# Patient Record
Sex: Male | Born: 1960 | Race: White | Hispanic: No | Marital: Married | Smoking: Never smoker
Health system: Southern US, Community
[De-identification: ages and names within clinical notes are randomized; demographics above are authoritative.]

## PROBLEM LIST (undated history)

## (undated) DIAGNOSIS — E785 Hyperlipidemia, unspecified: Secondary | ICD-10-CM

## (undated) DIAGNOSIS — I2510 Atherosclerotic heart disease of native coronary artery without angina pectoris: Secondary | ICD-10-CM

## (undated) HISTORY — DX: Hyperlipidemia, unspecified: E78.5

## (undated) HISTORY — DX: Atherosclerotic heart disease of native coronary artery without angina pectoris: I25.10

---

## 2006-06-08 ENCOUNTER — Encounter: Admission: RE | Admit: 2006-06-08 | Discharge: 2006-06-08 | Payer: Self-pay | Admitting: Internal Medicine

## 2006-10-12 ENCOUNTER — Encounter: Admission: RE | Admit: 2006-10-12 | Discharge: 2006-10-12 | Payer: Self-pay | Admitting: Internal Medicine

## 2007-10-20 IMAGING — CR DG CHEST 2V
3 series · 3 of 3 positions shown · non-contrast
Comparison: none

CLINICAL DATA: Cough.  Smoker.
 CHEST ? THREE VIEWS:
 Lungs are hyperaerated but clear of an active process.  Normal cardiomediastinal silhouette size and contours.  Intact bony thorax.

[view not recorded (1 of 3)]
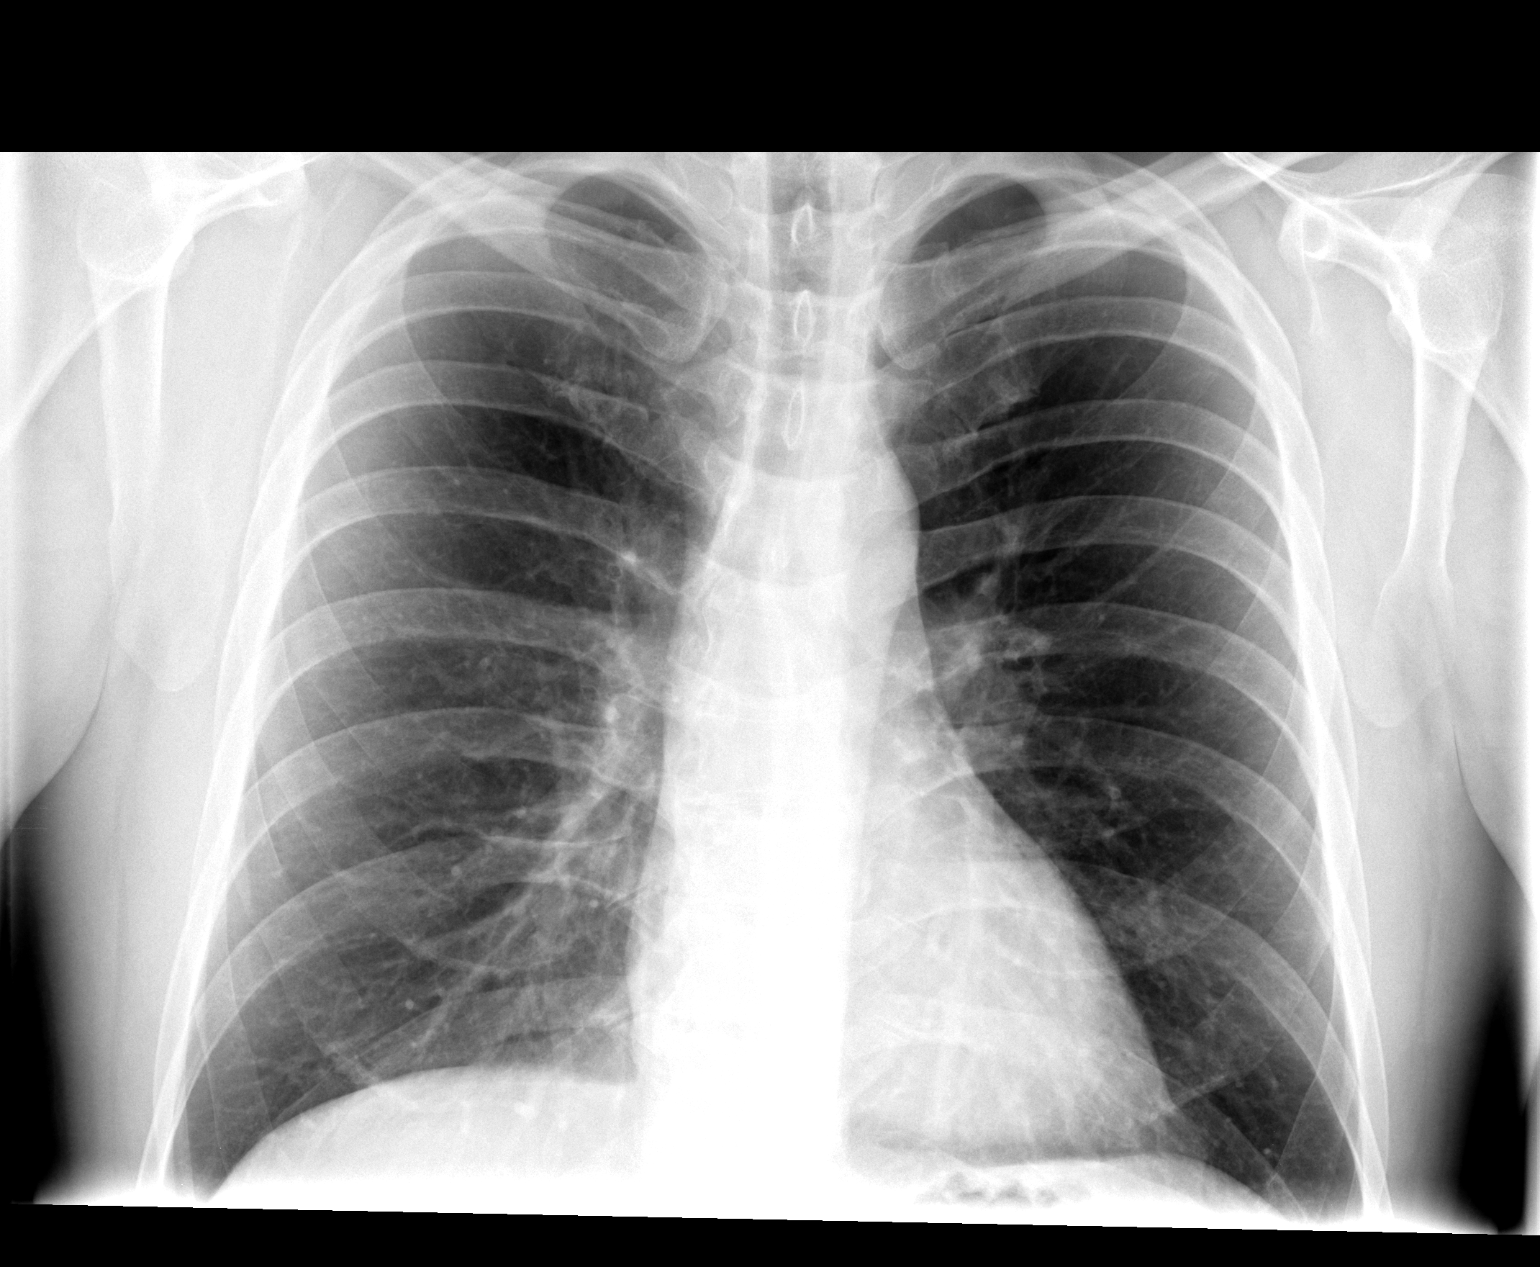

[view not recorded (2 of 3)]
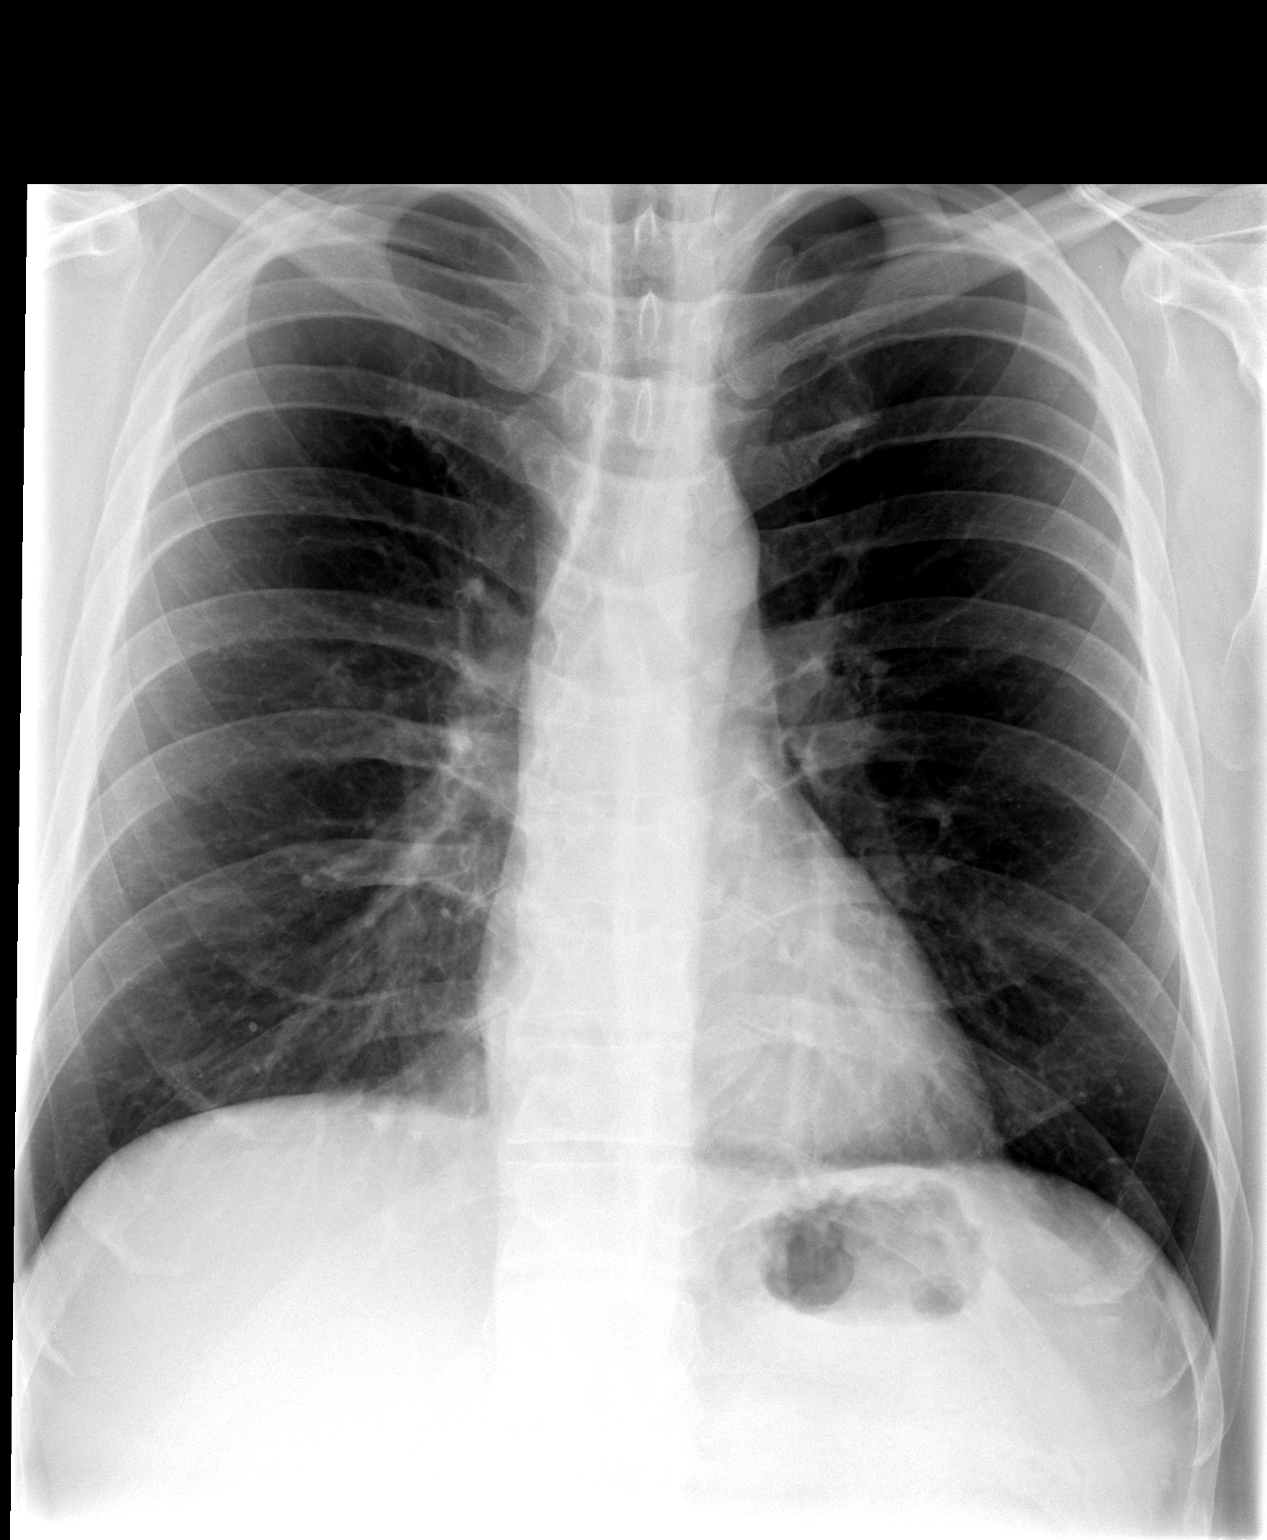

[view not recorded (3 of 3)]
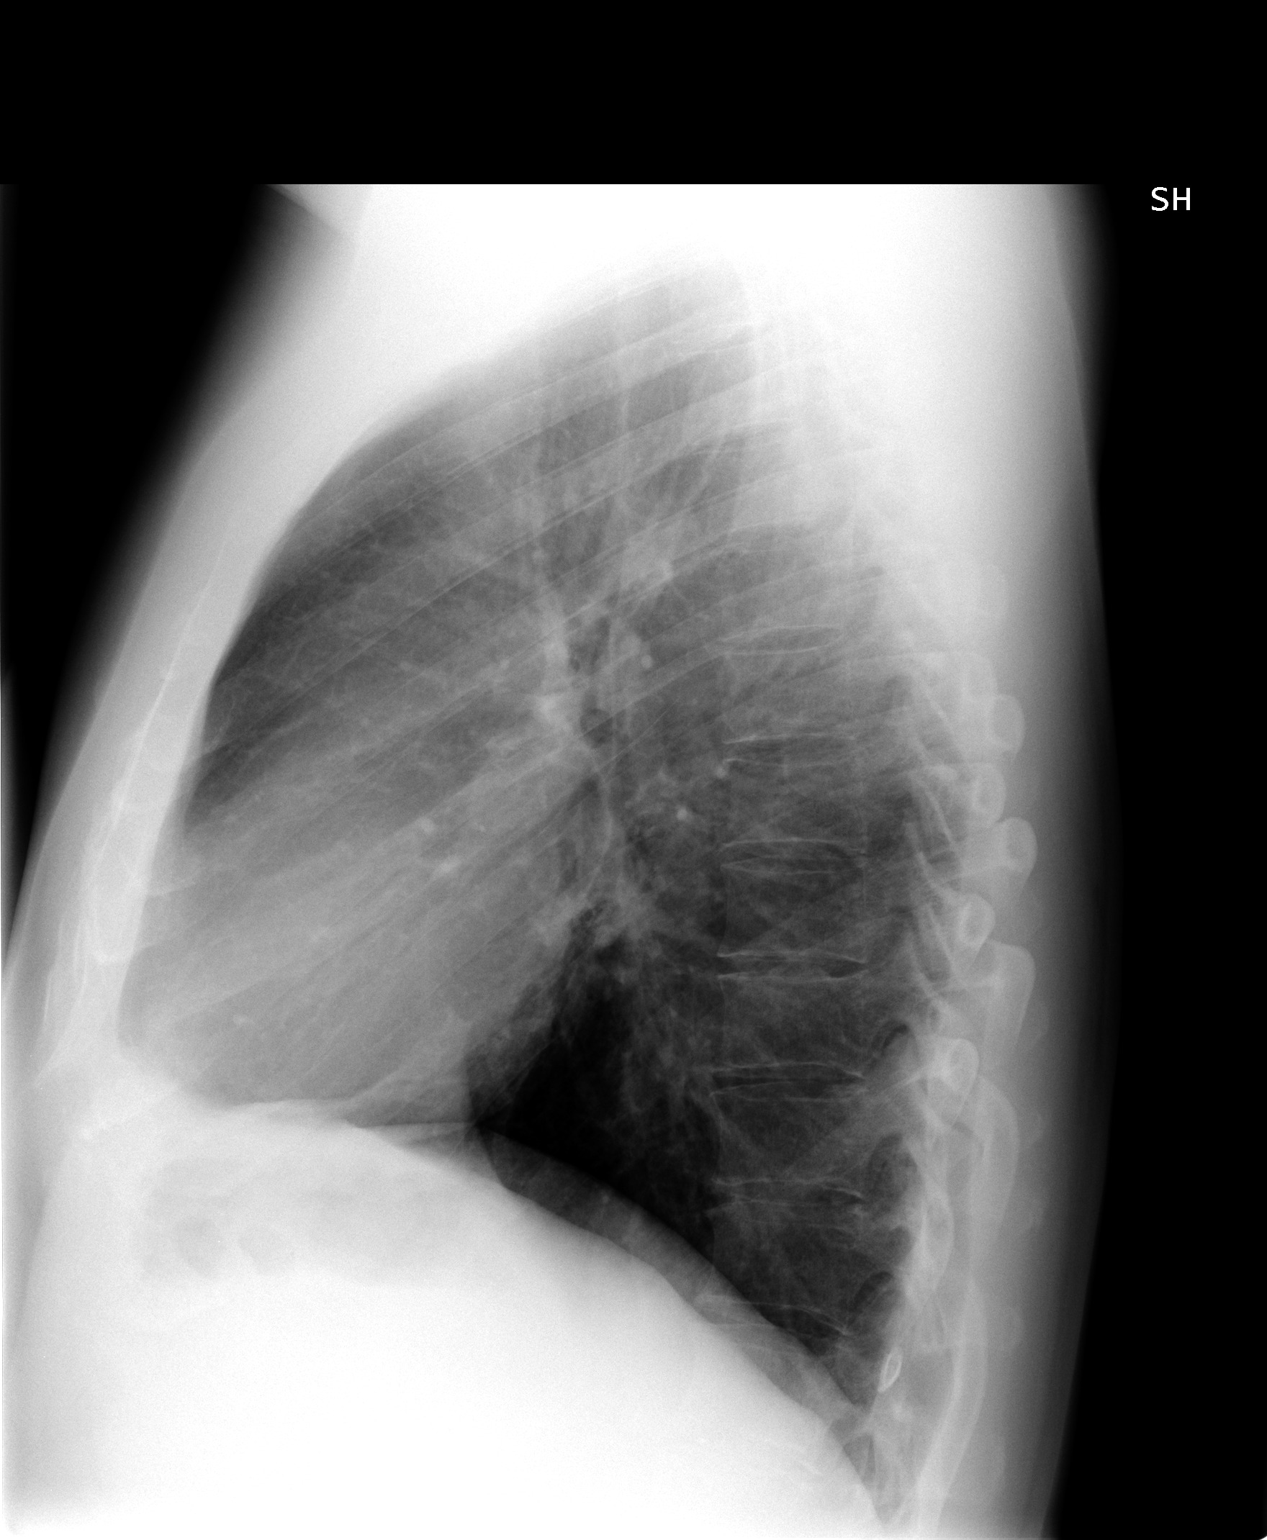

[3 of 3 positions shown; findings below may reference images not displayed]

IMPRESSION: Pulmonary hyperaeration may be due to COPD.  No acute chest findings.

## 2015-11-24 DIAGNOSIS — Z23 Encounter for immunization: Secondary | ICD-10-CM | POA: Diagnosis not present

## 2016-12-01 DIAGNOSIS — Z23 Encounter for immunization: Secondary | ICD-10-CM | POA: Diagnosis not present

## 2016-12-22 DIAGNOSIS — Z136 Encounter for screening for cardiovascular disorders: Secondary | ICD-10-CM | POA: Diagnosis not present

## 2016-12-22 DIAGNOSIS — Z23 Encounter for immunization: Secondary | ICD-10-CM | POA: Diagnosis not present

## 2016-12-22 DIAGNOSIS — Z125 Encounter for screening for malignant neoplasm of prostate: Secondary | ICD-10-CM | POA: Diagnosis not present

## 2016-12-22 DIAGNOSIS — Z0000 Encounter for general adult medical examination without abnormal findings: Secondary | ICD-10-CM | POA: Diagnosis not present

## 2017-11-23 DIAGNOSIS — Z23 Encounter for immunization: Secondary | ICD-10-CM | POA: Diagnosis not present

## 2018-01-23 DIAGNOSIS — Z0000 Encounter for general adult medical examination without abnormal findings: Secondary | ICD-10-CM | POA: Diagnosis not present

## 2018-05-10 DIAGNOSIS — K5730 Diverticulosis of large intestine without perforation or abscess without bleeding: Secondary | ICD-10-CM | POA: Diagnosis not present

## 2018-05-10 DIAGNOSIS — D123 Benign neoplasm of transverse colon: Secondary | ICD-10-CM | POA: Diagnosis not present

## 2018-05-10 DIAGNOSIS — Z86010 Personal history of colonic polyps: Secondary | ICD-10-CM | POA: Diagnosis not present

## 2018-05-10 DIAGNOSIS — K648 Other hemorrhoids: Secondary | ICD-10-CM | POA: Diagnosis not present

## 2018-05-14 DIAGNOSIS — D123 Benign neoplasm of transverse colon: Secondary | ICD-10-CM | POA: Diagnosis not present

## 2018-11-22 DIAGNOSIS — Z23 Encounter for immunization: Secondary | ICD-10-CM | POA: Diagnosis not present

## 2019-02-24 DIAGNOSIS — Z23 Encounter for immunization: Secondary | ICD-10-CM | POA: Diagnosis not present

## 2019-02-24 DIAGNOSIS — Z0000 Encounter for general adult medical examination without abnormal findings: Secondary | ICD-10-CM | POA: Diagnosis not present

## 2019-02-24 DIAGNOSIS — Z125 Encounter for screening for malignant neoplasm of prostate: Secondary | ICD-10-CM | POA: Diagnosis not present

## 2019-02-24 DIAGNOSIS — Z13220 Encounter for screening for lipoid disorders: Secondary | ICD-10-CM | POA: Diagnosis not present

## 2019-07-23 DIAGNOSIS — Z23 Encounter for immunization: Secondary | ICD-10-CM | POA: Diagnosis not present

## 2019-11-28 DIAGNOSIS — Z23 Encounter for immunization: Secondary | ICD-10-CM | POA: Diagnosis not present

## 2020-02-25 DIAGNOSIS — Z0000 Encounter for general adult medical examination without abnormal findings: Secondary | ICD-10-CM | POA: Diagnosis not present

## 2020-02-25 DIAGNOSIS — Z13220 Encounter for screening for lipoid disorders: Secondary | ICD-10-CM | POA: Diagnosis not present

## 2020-02-25 DIAGNOSIS — Z125 Encounter for screening for malignant neoplasm of prostate: Secondary | ICD-10-CM | POA: Diagnosis not present

## 2020-03-10 DIAGNOSIS — E538 Deficiency of other specified B group vitamins: Secondary | ICD-10-CM | POA: Diagnosis not present

## 2020-04-12 DIAGNOSIS — E538 Deficiency of other specified B group vitamins: Secondary | ICD-10-CM | POA: Diagnosis not present

## 2020-06-09 DIAGNOSIS — E538 Deficiency of other specified B group vitamins: Secondary | ICD-10-CM | POA: Diagnosis not present

## 2020-07-09 DIAGNOSIS — E538 Deficiency of other specified B group vitamins: Secondary | ICD-10-CM | POA: Diagnosis not present

## 2020-07-23 DIAGNOSIS — L720 Epidermal cyst: Secondary | ICD-10-CM | POA: Diagnosis not present

## 2020-08-26 DIAGNOSIS — E538 Deficiency of other specified B group vitamins: Secondary | ICD-10-CM | POA: Diagnosis not present

## 2020-09-20 DIAGNOSIS — E559 Vitamin D deficiency, unspecified: Secondary | ICD-10-CM | POA: Diagnosis not present

## 2020-09-20 DIAGNOSIS — R030 Elevated blood-pressure reading, without diagnosis of hypertension: Secondary | ICD-10-CM | POA: Diagnosis not present

## 2020-09-20 DIAGNOSIS — E538 Deficiency of other specified B group vitamins: Secondary | ICD-10-CM | POA: Diagnosis not present

## 2020-10-20 DIAGNOSIS — E538 Deficiency of other specified B group vitamins: Secondary | ICD-10-CM | POA: Diagnosis not present

## 2020-12-02 DIAGNOSIS — Z23 Encounter for immunization: Secondary | ICD-10-CM | POA: Diagnosis not present

## 2021-03-10 DIAGNOSIS — E538 Deficiency of other specified B group vitamins: Secondary | ICD-10-CM | POA: Diagnosis not present

## 2021-03-10 DIAGNOSIS — Z13220 Encounter for screening for lipoid disorders: Secondary | ICD-10-CM | POA: Diagnosis not present

## 2021-03-10 DIAGNOSIS — Z125 Encounter for screening for malignant neoplasm of prostate: Secondary | ICD-10-CM | POA: Diagnosis not present

## 2021-03-10 DIAGNOSIS — E559 Vitamin D deficiency, unspecified: Secondary | ICD-10-CM | POA: Diagnosis not present

## 2021-03-10 DIAGNOSIS — Z0000 Encounter for general adult medical examination without abnormal findings: Secondary | ICD-10-CM | POA: Diagnosis not present

## 2021-03-16 DIAGNOSIS — Z0000 Encounter for general adult medical examination without abnormal findings: Secondary | ICD-10-CM | POA: Diagnosis not present

## 2022-03-10 DIAGNOSIS — E538 Deficiency of other specified B group vitamins: Secondary | ICD-10-CM | POA: Diagnosis not present

## 2022-03-10 DIAGNOSIS — E7800 Pure hypercholesterolemia, unspecified: Secondary | ICD-10-CM | POA: Diagnosis not present

## 2022-03-10 DIAGNOSIS — Z0000 Encounter for general adult medical examination without abnormal findings: Secondary | ICD-10-CM | POA: Diagnosis not present

## 2022-03-10 DIAGNOSIS — E559 Vitamin D deficiency, unspecified: Secondary | ICD-10-CM | POA: Diagnosis not present

## 2022-03-10 DIAGNOSIS — Z125 Encounter for screening for malignant neoplasm of prostate: Secondary | ICD-10-CM | POA: Diagnosis not present

## 2022-03-10 DIAGNOSIS — R7309 Other abnormal glucose: Secondary | ICD-10-CM | POA: Diagnosis not present

## 2022-03-10 DIAGNOSIS — E669 Obesity, unspecified: Secondary | ICD-10-CM | POA: Diagnosis not present

## 2022-03-17 ENCOUNTER — Other Ambulatory Visit (HOSPITAL_COMMUNITY): Payer: Self-pay | Admitting: Family Medicine

## 2022-03-17 DIAGNOSIS — Z0000 Encounter for general adult medical examination without abnormal findings: Secondary | ICD-10-CM

## 2022-04-13 DIAGNOSIS — R109 Unspecified abdominal pain: Secondary | ICD-10-CM | POA: Diagnosis not present

## 2022-04-19 ENCOUNTER — Ambulatory Visit (HOSPITAL_BASED_OUTPATIENT_CLINIC_OR_DEPARTMENT_OTHER)
Admission: RE | Admit: 2022-04-19 | Discharge: 2022-04-19 | Disposition: A | Discharge status: Home / Self Care | Payer: Self-pay | Source: Ambulatory Visit | Attending: Family Medicine | Admitting: Family Medicine

## 2022-04-19 DIAGNOSIS — Z0000 Encounter for general adult medical examination without abnormal findings: Secondary | ICD-10-CM | POA: Insufficient documentation

## 2022-04-24 ENCOUNTER — Encounter: Payer: Self-pay | Admitting: Cardiovascular Disease

## 2022-04-24 NOTE — Progress Notes (Unsigned)
Cardiology Office Note:    Date:  04/26/2022   ID:  Mitchell Fox, DOB [birth year], MRN [id number]  PCP:  Mitchell Cruel, MD   Mitchell Fox Providers Cardiologist:  Mitchell Fox Click to update primary MD,subspecialty MD or APP then REFRESH:1}    Referring MD: No ref. provider found   Chief Complaint  Patient presents with   coronary calcification    History of Present Illness:    Mitchell Fox is a 62 y.o. male with a hx of coronary artery calcifications.  Seen with wife , Mitchell Fox    CAC score is 739 Left anterior descending artery: 422   Left circumflex artery: 75.3   Right coronary artery: 242   Total: 739   Percentile: 92  Walks frequently  Has not been walking for the past 3 months . Walked this weekend, no CP or dyspnea   + fam hx . Uncle died of MI in his 44s Grandfather had CAD   Here for good recent labs from his primary medical doctor from March 10, 2022 Total cholesterol is 189 HDL is 62 LDL is 110 Triglyceride level is 92 Hemoglobin A1c is 5.9 Creatinine is 0.83 Potassium is 4.6    History reviewed. No pertinent past medical history.  History reviewed. No pertinent surgical history.  Current Medications: Current Meds  Medication Sig   ezetimibe (ZETIA) 10 MG tablet Take 1 tablet (10 mg total) by mouth daily.   metoprolol tartrate (LOPRESSOR) 100 MG tablet Take 1 tablet by mouth 2 hours prior to scan   rosuvastatin (CRESTOR) 20 MG tablet Take 20 mg by mouth at bedtime.     Allergies:   Patient has no allergy information on record.   Social History   Socioeconomic History   Marital status: Married    Spouse name: Not on file   Number of children: Not on file   Years of education: Not on file   Highest education level: Not on file  Occupational History   Not on file  Tobacco Use   Smoking status: Never   Smokeless tobacco: Never  Substance and Sexual Activity   Alcohol use: Not on file   Drug use: Not on file    Sexual activity: Not on file  Other Topics Concern   Not on file  Social History Narrative   Not on file   Social Determinants of Health   Financial Resource Strain: Not on file  Food Insecurity: Not on file  Transportation Needs: Not on file  Physical Activity: Not on file  Stress: Not on file  Social Connections: Not on file     Family History: The patient's family history is not on file.  ROS:   Please see the history of present illness.     All other systems reviewed and are negative.  EKGs/Labs/Other Studies Reviewed:    The following studies were reviewed today:   EKG: April 25, 2022: Normal sinus rhythm at 75.  Incomplete right bundle branch block.  Inferior wall myocardial infarction, age undetermined Recent Labs: 04/25/2022: BUN 13; Creatinine, Ser 0.82; Potassium 4.0; Sodium 143  Recent Lipid Panel No results found for: "CHOL", "TRIG", "HDL", "CHOLHDL", "VLDL", "LDLCALC", "LDLDIRECT"   Risk Assessment/Calculations:                Physical Exam:    VS:  BP 130/76   Pulse 75   Ht 6' (1.829 m)   Wt 230 lb 3.2 oz (104.4 kg)   SpO2 98%   BMI  31.22 kg/m     Wt Readings from Last 3 Encounters:  04/25/22 230 lb 3.2 oz (104.4 kg)     GEN:  Well nourished, well developed in no acute distress HEENT: Normal NECK: No JVD; No carotid bruits LYMPHATICS: No lymphadenopathy CARDIAC: RRR, no murmurs, rubs, gallops RESPIRATORY:  Clear to auscultation without rales, wheezing or rhonchi  ABDOMEN: Soft, non-tender, non-distended MUSCULOSKELETAL:  No edema; No deformity  SKIN: Warm and dry NEUROLOGIC:  Alert and oriented x 3 PSYCHIATRIC:  Normal affect   ASSESSMENT:    1. Abnormal electrocardiogram   2. Mixed hyperlipidemia   3. Preprocedural cardiovascular examination    PLAN:      Coronary artery disease: Care presents with coronary calcium score 472.  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  I would like to do a  coronary CT angiogram for further evaluation of this.  2.  Hyperlipidemia: His last LDL was 110.  He is on rosuvastatin 20 mg a day.  Will add Zetia 10 mg a day. Will recheck lipids when I see him again in 4 to 6 weeks.  Will draw a lipoprotein a today.           Medication Adjustments/Labs and Tests Ordered: Current medicines are reviewed at length with the patient today.  Concerns regarding medicines are outlined above.  Orders Placed This Encounter  Procedures   CT CORONARY MORPH W/CTA COR W/SCORE W/CA W/CM &/OR WO/CM   Lipoprotein A (LPA)   Basic metabolic panel   Lipid panel   EKG 12-Lead   Meds ordered this encounter  Medications   metoprolol tartrate (LOPRESSOR) 100 MG tablet    Sig: Take 1 tablet by mouth 2 hours prior to scan    Dispense:  1 tablet    Refill:  0   ezetimibe (ZETIA) 10 MG tablet    Sig: Take 1 tablet (10 mg total) by mouth daily.    Dispense:  90 tablet    Refill:  3    Patient Instructions  Medication Instructions:  Start Ezetimibe (Zetia) '10mg'$  once daily  *If you need a refill on your cardiac medications before your next appointment, please call your pharmacy*   Lab Work: BMET and Lp(a) today  If you have labs (blood work) drawn today and your tests are completely normal, you will receive your results only by: [place] (if you have MyChart) OR A paper copy in the mail If you have any lab test that is abnormal or we need to change your treatment, we will call you to review the results.   Testing/Procedures: Coronary CTA Your physician has requested that you have cardiac CT. Cardiac computed tomography (CT) is a painless test that uses an x-ray machine to take clear, detailed pictures of your heart. For further information please visit HugeFiesta.[email or web address]. Please follow instruction sheet as given.     Follow-Up: At [facility], you and your health needs are our priority.  As part of our continuing mission to provide you  with exceptional heart care, we have created designated Provider Care Teams.  These Care Teams include your primary Cardiologist (physician) and Advanced Practice Providers (APPs -  Physician Assistants and Nurse Practitioners) who all work together to provide you with the care you need, when you need it.  We recommend signing up for the patient portal called "MyChart".  Sign up information is provided on this After Visit Summary.  MyChart is used to connect with  patients for Virtual Visits (Telemedicine).  Patients are able to view lab/test results, encounter notes, upcoming appointments, etc.  Non-urgent messages can be sent to your provider as well.   To learn more about what you can do with MyChart, go to [email or web address].    Your next appointment:   4 week(s)  Provider:   Mertie Moores, MD     Your cardiac CT will be scheduled at:   [facility] [address] [place], [place] [address] [phone]   Please arrive at the [facility] and Children's Entrance (Entrance C2) of [facility] 30 minutes prior to test start time. You can use the FREE valet parking offered at entrance C (encouraged to control the heart rate for the test)  Proceed to the [facility] Radiology Department (first floor) to check-in and test prep.  All radiology patients and guests should use entrance C2 at [facility], accessed from [facility], even though the hospital's physical address listed is [address]      Please follow these instructions carefully (unless otherwise directed):  Hold all erectile dysfunction medications at least 3 days (72 hrs) prior to test. (Ie viagra, cialis, sildenafil, tadalafil, etc) We will administer nitroglycerin during this exam.   On the Night Before the Test: Be sure to Drink plenty of water. Do not consume any caffeinated/decaffeinated beverages or chocolate 12 hours prior to your test. Do not take any  antihistamines 12 hours prior to your test.   On the Day of the Test: Drink plenty of water until 1 hour prior to the test. Do not eat any food 1 hour prior to test. You may take your regular medications prior to the test.  Take metoprolol (Lopressor) two hours prior to test.       After the Test: Drink plenty of water. After receiving IV contrast, you may experience a mild flushed feeling. This is normal. On occasion, you may experience a mild rash up to 24 hours after the test. This is not dangerous. If this occurs, you can take Benadryl 25 mg and increase your fluid intake. If you experience trouble breathing, this can be serious. If it is severe call 911 IMMEDIATELY. If it is mild, please call our office. If you take any of these medications: Glipizide/Metformin, Avandament, Glucavance, please do not take 48 hours after completing test unless otherwise instructed.  We will call to schedule your test 2-4 weeks out understanding that some insurance companies will need an authorization prior to the service being performed.   For non-scheduling related questions, please contact the cardiac imaging nurse navigator should you have any questions/concerns: Marchia Bond, Cardiac Imaging Nurse Navigator Gordy Clement, Cardiac Imaging Nurse Navigator [facility] Direct Office Dial: [phone]   For scheduling needs, including cancellations and rescheduling, please call [place], [phone].    Signed, Mertie Moores, MD  04/26/2022 8:53 AM    [place]

## 2022-04-24 NOTE — H&P (View-Only) (Signed)
Cardiology Office Note:    Date:  04/26/2022   ID:  Mitchell Fox, DOB [birth year], MRN [id number]  PCP:  Lawerance Cruel, MD   [place] Providers Cardiologist:  Sorina Derrig Click to update primary MD,subspecialty MD or APP then REFRESH:1}    Referring MD: No ref. provider found   Chief Complaint  Patient presents with   coronary calcification    History of Present Illness:    Mitchell Fox is a 62 y.o. male with a hx of coronary artery calcifications.  Seen with wife , [name]    CAC score is 739 Left anterior descending artery: 422   Left circumflex artery: 75.3   Right coronary artery: 242   Total: 739   Percentile: 92  Walks frequently  Has not been walking for the past 3 months . Walked this weekend, no CP or dyspnea   + fam hx . Uncle died of MI in his 44s Grandfather had CAD   Here for good recent labs from his primary medical doctor from March 10, 2022 Total cholesterol is 189 HDL is 62 LDL is 110 Triglyceride level is 92 Hemoglobin A1c is 5.9 Creatinine is 0.83 Potassium is 4.6    History reviewed. No pertinent past medical history.  History reviewed. No pertinent surgical history.  Current Medications: Current Meds  Medication Sig   ezetimibe (ZETIA) 10 MG tablet Take 1 tablet (10 mg total) by mouth daily.   metoprolol tartrate (LOPRESSOR) 100 MG tablet Take 1 tablet by mouth 2 hours prior to scan   rosuvastatin (CRESTOR) 20 MG tablet Take 20 mg by mouth at bedtime.     Allergies:   Patient has no allergy information on record.   Social History   Socioeconomic History   Marital status: Married    Spouse name: Not on file   Number of children: Not on file   Years of education: Not on file   Highest education level: Not on file  Occupational History   Not on file  Tobacco Use   Smoking status: Never   Smokeless tobacco: Never  Substance and Sexual Activity   Alcohol use: Not on file   Drug use: Not on file    Sexual activity: Not on file  Other Topics Concern   Not on file  Social History Narrative   Not on file   Social Determinants of Health   Financial Resource Strain: Not on file  Food Insecurity: Not on file  Transportation Needs: Not on file  Physical Activity: Not on file  Stress: Not on file  Social Connections: Not on file     Family History: The patient's family history is not on file.  ROS:   Please see the history of present illness.     All other systems reviewed and are negative.  EKGs/Labs/Other Studies Reviewed:    The following studies were reviewed today:   EKG: April 25, 2022: Normal sinus rhythm at 75.  Incomplete right bundle branch block.  Inferior wall myocardial infarction, age undetermined Recent Labs: 04/25/2022: BUN 13; Creatinine, Ser 0.82; Potassium 4.0; Sodium 143  Recent Lipid Panel No results found for: "CHOL", "TRIG", "HDL", "CHOLHDL", "VLDL", "LDLCALC", "LDLDIRECT"   Risk Assessment/Calculations:                Physical Exam:    VS:  BP 130/76   Pulse 75   Ht 6' (1.829 m)   Wt 230 lb 3.2 oz (104.4 kg)   SpO2 98%   BMI  31.22 kg/m     Wt Readings from Last 3 Encounters:  04/25/22 230 lb 3.2 oz (104.4 kg)     GEN:  Well nourished, well developed in no acute distress HEENT: Normal NECK: No JVD; No carotid bruits LYMPHATICS: No lymphadenopathy CARDIAC: RRR, no murmurs, rubs, gallops RESPIRATORY:  Clear to auscultation without rales, wheezing or rhonchi  ABDOMEN: Soft, non-tender, non-distended MUSCULOSKELETAL:  No edema; No deformity  SKIN: Warm and dry NEUROLOGIC:  Alert and oriented x 3 PSYCHIATRIC:  Normal affect   ASSESSMENT:    1. Abnormal electrocardiogram   2. Mixed hyperlipidemia   3. Preprocedural cardiovascular examination    PLAN:      Coronary artery disease: Care presents with coronary calcium score 472.  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  I would like to do a  coronary CT angiogram for further evaluation of this.  2.  Hyperlipidemia: His last LDL was 110.  He is on rosuvastatin 20 mg a day.  Will add Zetia 10 mg a day. Will recheck lipids when I see him again in 4 to 6 weeks.  Will draw a lipoprotein a today.           Medication Adjustments/Labs and Tests Ordered: Current medicines are reviewed at length with the patient today.  Concerns regarding medicines are outlined above.  Orders Placed This Encounter  Procedures   CT CORONARY MORPH W/CTA COR W/SCORE W/CA W/CM &/OR WO/CM   Lipoprotein A (LPA)   Basic metabolic panel   Lipid panel   EKG 12-Lead   Meds ordered this encounter  Medications   metoprolol tartrate (LOPRESSOR) 100 MG tablet    Sig: Take 1 tablet by mouth 2 hours prior to scan    Dispense:  1 tablet    Refill:  0   ezetimibe (ZETIA) 10 MG tablet    Sig: Take 1 tablet (10 mg total) by mouth daily.    Dispense:  90 tablet    Refill:  3    Patient Instructions  Medication Instructions:  Start Ezetimibe (Zetia) '10mg'$  once daily  *If you need a refill on your cardiac medications before your next appointment, please call your pharmacy*   Lab Work: BMET and Lp(a) today  If you have labs (blood work) drawn today and your tests are completely normal, you will receive your results only by: [place] (if you have MyChart) OR A paper copy in the mail If you have any lab test that is abnormal or we need to change your treatment, we will call you to review the results.   Testing/Procedures: Coronary CTA Your physician has requested that you have cardiac CT. Cardiac computed tomography (CT) is a painless test that uses an x-ray machine to take clear, detailed pictures of your heart. For further information please visit HugeFiesta.[email or web address]. Please follow instruction sheet as given.     Follow-Up: At [facility], you and your health needs are our priority.  As part of our continuing mission to provide you  with exceptional heart care, we have created designated Provider Care Teams.  These Care Teams include your primary Cardiologist (physician) and Advanced Practice Providers (APPs -  Physician Assistants and Nurse Practitioners) who all work together to provide you with the care you need, when you need it.  We recommend signing up for the patient portal called "MyChart".  Sign up information is provided on this After Visit Summary.  MyChart is used to connect with  patients for Virtual Visits (Telemedicine).  Patients are able to view lab/test results, encounter notes, upcoming appointments, etc.  Non-urgent messages can be sent to your provider as well.   To learn more about what you can do with MyChart, go to [email or web address].    Your next appointment:   4 week(s)  Provider:   Mertie Moores, MD     Your cardiac CT will be scheduled at:   [facility] [address] [place], [place] [address] [phone]   Please arrive at the [facility] and Children's Entrance (Entrance C2) of [facility] 30 minutes prior to test start time. You can use the FREE valet parking offered at entrance C (encouraged to control the heart rate for the test)  Proceed to the [facility] Radiology Department (first floor) to check-in and test prep.  All radiology patients and guests should use entrance C2 at [facility], accessed from [facility], even though the hospital's physical address listed is [address]      Please follow these instructions carefully (unless otherwise directed):  Hold all erectile dysfunction medications at least 3 days (72 hrs) prior to test. (Ie viagra, cialis, sildenafil, tadalafil, etc) We will administer nitroglycerin during this exam.   On the Night Before the Test: Be sure to Drink plenty of water. Do not consume any caffeinated/decaffeinated beverages or chocolate 12 hours prior to your test. Do not take any  antihistamines 12 hours prior to your test.   On the Day of the Test: Drink plenty of water until 1 hour prior to the test. Do not eat any food 1 hour prior to test. You may take your regular medications prior to the test.  Take metoprolol (Lopressor) two hours prior to test.       After the Test: Drink plenty of water. After receiving IV contrast, you may experience a mild flushed feeling. This is normal. On occasion, you may experience a mild rash up to 24 hours after the test. This is not dangerous. If this occurs, you can take Benadryl 25 mg and increase your fluid intake. If you experience trouble breathing, this can be serious. If it is severe call 911 IMMEDIATELY. If it is mild, please call our office. If you take any of these medications: Glipizide/Metformin, Avandament, Glucavance, please do not take 48 hours after completing test unless otherwise instructed.  We will call to schedule your test 2-4 weeks out understanding that some insurance companies will need an authorization prior to the service being performed.   For non-scheduling related questions, please contact the cardiac imaging nurse navigator should you have any questions/concerns: Marchia Bond, Cardiac Imaging Nurse Navigator Gordy Clement, Cardiac Imaging Nurse Navigator [facility] Direct Office Dial: [phone]   For scheduling needs, including cancellations and rescheduling, please call [place], [phone].    Signed, Mertie Moores, MD  04/26/2022 8:53 AM    [place]

## 2022-04-25 ENCOUNTER — Encounter: Payer: Self-pay | Admitting: Cardiovascular Disease

## 2022-04-25 ENCOUNTER — Ambulatory Visit: Payer: BC Managed Care – PPO | Attending: Cardiovascular Disease | Admitting: Cardiovascular Disease

## 2022-04-25 VITALS — BP 130/76 | HR 75 | Ht 72.0 in | Wt 230.2 lb

## 2022-04-25 DIAGNOSIS — E782 Mixed hyperlipidemia: Secondary | ICD-10-CM

## 2022-04-25 DIAGNOSIS — Z01810 Encounter for preprocedural cardiovascular examination: Secondary | ICD-10-CM | POA: Diagnosis not present

## 2022-04-25 DIAGNOSIS — R9431 Abnormal electrocardiogram [ECG] [EKG]: Secondary | ICD-10-CM | POA: Diagnosis not present

## 2022-04-25 MED ORDER — EZETIMIBE 10 MG PO TABS: 10.0000 mg | ORAL_TABLET | Freq: Every day | ORAL | 3 refills | Status: DC

## 2022-04-25 MED ORDER — METOPROLOL TARTRATE 100 MG PO TABS: ORAL_TABLET | ORAL | 0 refills | Status: DC

## 2022-04-25 NOTE — Patient Instructions (Signed)
Medication Instructions:  Start Ezetimibe (Zetia) '10mg'$  once daily  *If you need a refill on your cardiac medications before your next appointment, please call your pharmacy*   Lab Work: BMET and Lp(a) today  If you have labs (blood work) drawn today and your tests are completely normal, you will receive your results only by: [place] (if you have MyChart) OR A paper copy in the mail If you have any lab test that is abnormal or we need to change your treatment, we will call you to review the results.   Testing/Procedures: Coronary CTA Your physician has requested that you have cardiac CT. Cardiac computed tomography (CT) is a painless test that uses an x-ray machine to take clear, detailed pictures of your heart. For further information please visit HugeFiesta.[email or web address]. Please follow instruction sheet as given.     Follow-Up: At [facility], you and your health needs are our priority.  As part of our continuing mission to provide you with exceptional heart care, we have created designated Provider Care Teams.  These Care Teams include your primary Cardiologist (physician) and Advanced Practice Providers (APPs -  Physician Assistants and Nurse Practitioners) who all work together to provide you with the care you need, when you need it.  We recommend signing up for the patient portal called "MyChart".  Sign up information is provided on this After Visit Summary.  MyChart is used to connect with patients for Virtual Visits (Telemedicine).  Patients are able to view lab/test results, encounter notes, upcoming appointments, etc.  Non-urgent messages can be sent to your provider as well.   To learn more about what you can do with MyChart, go to [email or web address].    Your next appointment:   4 week(s)  Provider:   Mertie Moores, MD     Your cardiac CT will be scheduled at:   [facility] [address] [place], [place] [address] [phone]   Please arrive at the [facility] and Children's Entrance (Entrance C2) of [facility] 30 minutes prior to test start time. You can use the FREE valet parking offered at entrance C (encouraged to control the heart rate for the test)  Proceed to the [facility] Radiology Department (first floor) to check-in and test prep.  All radiology patients and guests should use entrance C2 at [facility], accessed from [facility], even though the hospital's physical address listed is [address]      Please follow these instructions carefully (unless otherwise directed):  Hold all erectile dysfunction medications at least 3 days (72 hrs) prior to test. (Ie viagra, cialis, sildenafil, tadalafil, etc) We will administer nitroglycerin during this exam.   On the Night Before the Test: Be sure to Drink plenty of water. Do not consume any caffeinated/decaffeinated beverages or chocolate 12 hours prior to your test. Do not take any antihistamines 12 hours prior to your test.   On the Day of the Test: Drink plenty of water until 1 hour prior to the test. Do not eat any food 1 hour prior to test. You may take your regular medications prior to the test.  Take metoprolol (Lopressor) two hours prior to test.       After the Test: Drink plenty of water. After receiving IV contrast, you may experience a mild flushed feeling. This is normal. On occasion, you may experience a mild rash up to 24 hours after the test. This is not dangerous. If this occurs, you  can take Benadryl 25 mg and increase your fluid intake. If you experience trouble breathing, this can be serious. If it is severe call 911 IMMEDIATELY. If it is mild, please call our office. If you take any of these medications: Glipizide/Metformin, Avandament, Glucavance, please do not take 48 hours after completing test unless otherwise instructed.  We will call to schedule your test 2-4 weeks out  understanding that some insurance companies will need an authorization prior to the service being performed.   For non-scheduling related questions, please contact the cardiac imaging nurse navigator should you have any questions/concerns: Marchia Bond, Cardiac Imaging Nurse Navigator Gordy Clement, Cardiac Imaging Nurse Navigator [facility] Direct Office Dial: [phone]   For scheduling needs, including cancellations and rescheduling, please call [place], [phone].

## 2022-04-26 LAB — BASIC METABOLIC PANEL
BUN: 13 mg/dL (ref 8–27)
Chloride: 104 mmol/L (ref 96–106)
Potassium: 4 mmol/L (ref 3.5–5.2)
Sodium: 143 mmol/L (ref 134–144)
eGFR: 99 mL/min/{1.73_m2} (ref >59)

## 2022-04-26 LAB — LIPOPROTEIN A (LPA)

## 2022-04-27 LAB — BASIC METABOLIC PANEL
BUN/Creatinine Ratio: 16 (ref 10–24)
CO2: 23 mmol/L (ref 20–29)
Calcium: 9.4 mg/dL (ref 8.6–10.2)
Creatinine, Ser: 0.82 mg/dL (ref 0.76–1.27)
Glucose: 82 mg/dL (ref 70–99)

## 2022-05-02 ENCOUNTER — Telehealth (HOSPITAL_COMMUNITY): Payer: Self-pay | Admitting: Emergency Medicine

## 2022-05-02 NOTE — Telephone Encounter (Signed)
Reaching out to patient to offer assistance regarding upcoming cardiac imaging study; pt verbalizes understanding of appt date/time, parking situation and where to check in, pre-test NPO status and medications ordered, and verified current allergies; name and call back number provided for further questions should they arise Graviel Payeur RN Navigator Cardiac Imaging [facility] [phone] office [phone] cell  Arrival [address] entrance Denies iv issues 100mg metoprolol tartrate  Aware contrast/nitro   

## 2022-05-03 ENCOUNTER — Ambulatory Visit (HOSPITAL_COMMUNITY)
Admission: RE | Admit: 2022-05-03 | Discharge: 2022-05-03 | Disposition: A | Discharge status: Home / Self Care | Payer: BC Managed Care – PPO | Source: Ambulatory Visit | Attending: Cardiovascular Disease | Admitting: Cardiovascular Disease

## 2022-05-03 DIAGNOSIS — I2510 Atherosclerotic heart disease of native coronary artery without angina pectoris: Secondary | ICD-10-CM | POA: Diagnosis not present

## 2022-05-03 DIAGNOSIS — Z01810 Encounter for preprocedural cardiovascular examination: Secondary | ICD-10-CM | POA: Diagnosis not present

## 2022-05-03 DIAGNOSIS — R9431 Abnormal electrocardiogram [ECG] [EKG]: Secondary | ICD-10-CM

## 2022-05-03 DIAGNOSIS — E782 Mixed hyperlipidemia: Secondary | ICD-10-CM | POA: Insufficient documentation

## 2022-05-03 MED ORDER — IOHEXOL 350 MG/ML SOLN
200.0000 mL | Freq: Once | INTRAVENOUS | Status: AC | PRN
  Administered 2022-05-03: 200 mL via INTRAVENOUS

## 2022-05-03 MED ORDER — NITROGLYCERIN 0.4 MG SL SUBL
SUBLINGUAL_TABLET | SUBLINGUAL | Status: AC
  Filled 2022-05-03: qty 2

## 2022-05-03 MED ORDER — NITROGLYCERIN 0.4 MG SL SUBL
0.8000 mg | SUBLINGUAL_TABLET | Freq: Once | SUBLINGUAL | Status: AC
  Administered 2022-05-03: 0.8 mg via SUBLINGUAL

## 2022-05-04 ENCOUNTER — Ambulatory Visit (INDEPENDENT_AMBULATORY_CARE_PROVIDER_SITE_OTHER): Payer: BC Managed Care – PPO

## 2022-05-04 ENCOUNTER — Encounter: Payer: Self-pay | Admitting: Cardiovascular Disease

## 2022-05-04 ENCOUNTER — Other Ambulatory Visit: Payer: Self-pay | Admitting: Cardiovascular Disease

## 2022-05-04 ENCOUNTER — Ambulatory Visit (HOSPITAL_BASED_OUTPATIENT_CLINIC_OR_DEPARTMENT_OTHER)
Admission: RE | Admit: 2022-05-04 | Discharge: 2022-05-04 | Disposition: A | Discharge status: Home / Self Care | Payer: BC Managed Care – PPO | Source: Ambulatory Visit | Attending: Cardiology | Admitting: Cardiology

## 2022-05-04 ENCOUNTER — Ambulatory Visit: Payer: BC Managed Care – PPO | Attending: Internal Medicine

## 2022-05-04 ENCOUNTER — Other Ambulatory Visit (HOSPITAL_COMMUNITY): Payer: Self-pay | Admitting: Emergency Medicine

## 2022-05-04 ENCOUNTER — Telehealth: Payer: Self-pay | Admitting: *Deleted

## 2022-05-04 DIAGNOSIS — R931 Abnormal findings on diagnostic imaging of heart and coronary circulation: Secondary | ICD-10-CM | POA: Diagnosis not present

## 2022-05-04 DIAGNOSIS — I2584 Coronary atherosclerosis due to calcified coronary lesion: Secondary | ICD-10-CM | POA: Diagnosis not present

## 2022-05-04 DIAGNOSIS — E785 Hyperlipidemia, unspecified: Secondary | ICD-10-CM | POA: Diagnosis not present

## 2022-05-04 DIAGNOSIS — I2510 Atherosclerotic heart disease of native coronary artery without angina pectoris: Secondary | ICD-10-CM

## 2022-05-04 DIAGNOSIS — I25119 Atherosclerotic heart disease of native coronary artery with unspecified angina pectoris: Secondary | ICD-10-CM

## 2022-05-04 DIAGNOSIS — Z01812 Encounter for preprocedural laboratory examination: Secondary | ICD-10-CM

## 2022-05-04 DIAGNOSIS — R0602 Shortness of breath: Secondary | ICD-10-CM | POA: Diagnosis not present

## 2022-05-04 DIAGNOSIS — Z79899 Other long term (current) drug therapy: Secondary | ICD-10-CM | POA: Diagnosis not present

## 2022-05-04 DIAGNOSIS — I2582 Chronic total occlusion of coronary artery: Secondary | ICD-10-CM | POA: Diagnosis not present

## 2022-05-04 LAB — CBC
Hematocrit: 42.1 % (ref 37.5–51.0)
Hemoglobin: 14.2 g/dL (ref 13.0–17.7)
MCH: 30.2 pg (ref 26.6–33.0)
MCHC: 33.7 g/dL (ref 31.5–35.7)
MCV: 90 fL (ref 79–97)
Platelets: 246 10*3/uL (ref 150–450)
RBC: 4.7 x10E6/uL (ref 4.14–5.80)
RDW: 14 % (ref 11.6–15.4)
WBC: 6.2 10*3/uL (ref 3.4–10.8)

## 2022-05-04 LAB — ECHOCARDIOGRAM COMPLETE
Area-P 1/2: 2.99 cm2
S' Lateral: 3.6 cm

## 2022-05-04 NOTE — Addendum Note (Signed)
Addended by: Katrine Coho on: 05/04/2022 10:10 AM   Modules accepted: Orders

## 2022-05-04 NOTE — Telephone Encounter (Addendum)
Cardiac Catheterization scheduled at [facility] for Friday May 05, 2022 1:30 PM-this is a time change  Arrival time [place] Entrance A at: 11:30 AM  Nothing to eat after midnight prior to procedure, clear liquids until 5 AM day of procedure.  Medication instructions: -Usual morning medications can be taken with sips of water including aspirin 81 mg.  Confirmed patient has responsible adult to drive home post procedure and be with patient first 24 hours after arriving home.  Plan to go home the same day, you will only stay overnight if medically necessary.  Reviewed procedure instructions with patient.

## 2022-05-04 NOTE — Progress Notes (Unsigned)
Orders for cath written

## 2022-05-04 NOTE — Progress Notes (Unsigned)
   Pt was seen on March 5 for some dyspnea and abnormal coronary Calcium score .  Please see the note from March 5 for details of that visit.  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  He understands and agrees to proceed.  We are attempting to schedule him for an echo today .    Mertie Moores, MD  05/04/2022 11:06 AM    [address],  [place], [place]  [address] Phone: [phone]; Fax: [phone]

## 2022-05-04 NOTE — Progress Notes (Signed)
Cardiology Office Note:    Date:  04/26/2022   ID:  Mitchell Fox, DOB [birth year], MRN [id number]  PCP:  Mitchell Floro, MD   [facility] Providers Cardiologist:  Mitchell Fox Click to update primary MD,subspecialty MD or APP then REFRESH:1}    Referring MD: No ref. provider found   Chief Complaint  Patient presents with   coronary calcification    History of Present Illness:    Mitchell Fox is a 62 y.o. male with a hx of coronary artery calcifications.  Seen with wife , Mitchell Fox   May have some dyspnea with walking but no chest pain or chest pressure Has evidence of a previous INf. MI on his ECG  He does not ever recall having several days of feeling poorly     CAC score is 739 Left anterior descending artery: 422   Left circumflex artery: 75.3   Right coronary artery: 242   Total: 739   Percentile: 92  Walks frequently  Has not been walking for the past 3 months . Walked this weekend, no CP or dyspnea   + fam hx . Uncle died of MI in his 33s Grandfather had CAD   Here for good recent labs from his primary medical doctor from March 10, 2022 Total cholesterol is 189 HDL is 62 LDL is 110 Triglyceride level is 92 Hemoglobin A1c is 5.9 Creatinine is 0.83 Potassium is 4.6    History reviewed. No pertinent past medical history.  History reviewed. No pertinent surgical history.  Current Medications: Current Meds  Medication Sig   ezetimibe (ZETIA) 10 MG tablet Take 1 tablet (10 mg total) by mouth daily.   metoprolol tartrate (LOPRESSOR) 100 MG tablet Take 1 tablet by mouth 2 hours prior to scan   rosuvastatin (CRESTOR) 20 MG tablet Take 20 mg by mouth at bedtime.     Allergies:   Patient has no allergy information on record.   Social History   Socioeconomic History   Marital status: Married    Spouse name: Not on file   Number of children: Not on file   Years of education: Not on file   Highest education level: Not on file   Occupational History   Not on file  Tobacco Use   Smoking status: Never   Smokeless tobacco: Never  Substance and Sexual Activity   Alcohol use: Not on file   Drug use: Not on file   Sexual activity: Not on file  Other Topics Concern   Not on file  Social History Narrative   Not on file   Social Determinants of Health   Financial Resource Strain: Not on file  Food Insecurity: Not on file  Transportation Needs: Not on file  Physical Activity: Not on file  Stress: Not on file  Social Connections: Not on file     Family History: The patient's family history is not on file.  ROS:   Please see the history of present illness.     All other systems reviewed and are negative.  EKGs/Labs/Other Studies Reviewed:    The following studies were reviewed today:   EKG: April 25, 2022: Normal sinus rhythm at 75.  Incomplete right bundle branch block.  Inferior wall myocardial infarction, age undetermined Recent Labs: 04/25/2022: BUN 13; Creatinine, Ser 0.82; Potassium 4.0; Sodium 143  Recent Lipid Panel No results found for: "CHOL", "TRIG", "HDL", "CHOLHDL", "VLDL", "LDLCALC", "LDLDIRECT"   Risk Assessment/Calculations:  Physical Exam:    VS:  BP 130/76   Pulse 75   Ht 6' (1.829 m)   Wt 230 lb 3.2 oz (104.4 kg)   SpO2 98%   BMI 31.22 kg/m     Wt Readings from Last 3 Encounters:  04/25/22 230 lb 3.2 oz (104.4 kg)     GEN:  Well nourished, well developed in no acute distress HEENT: Normal NECK: No JVD; No carotid bruits LYMPHATICS: No lymphadenopathy CARDIAC: RRR, no murmurs, rubs, gallops RESPIRATORY:  Clear to auscultation without rales, wheezing or rhonchi  ABDOMEN: Soft, non-tender, non-distended MUSCULOSKELETAL:  No edema; No deformity  SKIN: Warm and dry NEUROLOGIC:  Alert and oriented x 3 PSYCHIATRIC:  Normal affect   ASSESSMENT:    1. Abnormal electrocardiogram   2. Mixed hyperlipidemia   3. Preprocedural cardiovascular examination     PLAN:      Coronary artery disease: Care presents with coronary calcium score 472.  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  I would like to do a coronary CT angiogram for further evaluation of this.  2.  Hyperlipidemia: His last LDL was 110.  He is on rosuvastatin 20 mg a day.  Will add Zetia 10 mg a day. Will recheck lipids when I see him again in 4 to 6 weeks.  He has severe coronary artery calcifications at a young age.    Also has a strong family history of premature CAD .   I would like to order a LP(a) as this may alter our treatment plans.      Medication Adjustments/Labs and Tests Ordered: Current medicines are reviewed at length with the patient today.  Concerns regarding medicines are outlined above.  Orders Placed This Encounter  Procedures   CT CORONARY MORPH W/CTA COR W/SCORE W/CA W/CM &/OR WO/CM   Lipoprotein A (LPA)   Basic metabolic panel   Lipid panel   EKG 12-Lead   Meds ordered this encounter  Medications   metoprolol tartrate (LOPRESSOR) 100 MG tablet    Sig: Take 1 tablet by mouth 2 hours prior to scan    Dispense:  1 tablet    Refill:  0   ezetimibe (ZETIA) 10 MG tablet    Sig: Take 1 tablet (10 mg total) by mouth daily.    Dispense:  90 tablet    Refill:  3    Patient Instructions  Medication Instructions:  Start Ezetimibe (Zetia) 10mg  once daily  *If you need a refill on your cardiac medications before your next appointment, please call your pharmacy*   Lab Work: BMET and Lp(a) today  If you have labs (blood work) drawn today and your tests are completely normal, you will receive your results only by: MyChart Message (if you have MyChart) OR A paper copy in the mail If you have any lab test that is abnormal or we need to change your treatment, we will call you to review the results.   Testing/Procedures: Coronary CTA Your physician has requested that you have cardiac CT. Cardiac computed tomography  (CT) is a painless test that uses an x-ray machine to take clear, detailed pictures of your heart. For further information please visit [email or web address] Please follow instruction sheet as given.     Follow-Up: At [facility], you and your health needs are our priority.  As part of our continuing mission to provide you with exceptional heart care, we have created designated Provider Care Teams.  These Care  Teams include your primary Cardiologist (physician) and Advanced Practice Providers (APPs -  Physician Assistants and Nurse Practitioners) who all work together to provide you with the care you need, when you need it.  We recommend signing up for the patient portal called "MyChart".  Sign up information is provided on this After Visit Summary.  MyChart is used to connect with patients for Virtual Visits (Telemedicine).  Patients are able to view lab/test results, encounter notes, upcoming appointments, etc.  Non-urgent messages can be sent to your provider as well.   To learn more about what you can do with MyChart, go to [email or web address].    Your next appointment:   4 week(s)  Provider:   Kristeen Miss, MD     Your cardiac CT will be scheduled at:   [facility] [address] [place], [place] [address] [phone]   Please arrive at the [facility] En[facility] (Entrance C2) of [facility] 30 minutes prior to test start time. You can use the FREE valet parking offered at entrance C (encouraged to control the heart rate for the test)  Proceed to the [facility] Radiology Department (first floor) to check-in and test prep.  All radiology patients and guests should use entrance C2 at [facility], accessed from [facility], even though the hospital's physical address listed is [address]      Please follow these instructions carefully (unless otherwise directed):  Hold all erectile dysfunction  medications at least 3 days (72 hrs) prior to test. (Ie viagra, cialis, sildenafil, tadalafil, etc) We will administer nitroglycerin during this exam.   On the Night Before the Test: Be sure to Drink plenty of water. Do not consume any caffeinated/decaffeinated beverages or chocolate 12 hours prior to your test. Do not take any antihistamines 12 hours prior to your test.   On the Day of the Test: Drink plenty of water until 1 hour prior to the test. Do not eat any food 1 hour prior to test. You may take your regular medications prior to the test.  Take metoprolol (Lopressor) two hours prior to test.       After the Test: Drink plenty of water. After receiving IV contrast, you may experience a mild flushed feeling. This is normal. On occasion, you may experience a mild rash up to 24 hours after the test. This is not dangerous. If this occurs, you can take Benadryl 25 mg and increase your fluid intake. If you experience trouble breathing, this can be serious. If it is severe call 911 IMMEDIATELY. If it is mild, please call our office. If you take any of these medications: Glipizide/Metformin, Avandament, Glucavance, please do not take 48 hours after completing test unless otherwise instructed.  We will call to schedule your test 2-4 weeks out understanding that some insurance companies will need an authorization prior to the service being performed.   For non-scheduling related questions, please contact the cardiac imaging nurse navigator should you have any questions/concerns: Rockwell Alexandria, Cardiac Imaging Nurse Navigator Larey Brick, Cardiac Imaging Nurse Navigator  Heart and Vascular Services Direct Office Dial: [phone]   For scheduling needs, including cancellations and rescheduling, please call [place], [phone].    Signed, Kristeen Miss, MD  04/26/2022 8:53 AM    [facility]

## 2022-05-04 NOTE — Pre-Procedure Instructions (Signed)
Left voicemail for patient that cath procedure has been scheduled.   Procedure tomorrow 3/15 @ 1:30, arrive at the hospital tomorrow @ 1100.  Nothing to eat or drink after midnight.  Ok to take morning medications.  Plan to have someone drive you home and stay with him for 24 hours.  Please take Baby ASA '81mg'$  in the morning.

## 2022-05-05 ENCOUNTER — Ambulatory Visit (HOSPITAL_COMMUNITY)
Admission: RE | Admit: 2022-05-05 | Discharge: 2022-05-05 | Disposition: A | Discharge status: Home / Self Care | Payer: BC Managed Care – PPO | Attending: Cardiovascular Disease | Admitting: Cardiovascular Disease

## 2022-05-05 ENCOUNTER — Other Ambulatory Visit: Payer: Self-pay

## 2022-05-05 ENCOUNTER — Encounter (HOSPITAL_COMMUNITY)
Admission: RE | Disposition: A | Discharge status: Home / Self Care | Payer: Self-pay | Source: Home / Self Care | Attending: Cardiovascular Disease

## 2022-05-05 DIAGNOSIS — I2510 Atherosclerotic heart disease of native coronary artery without angina pectoris: Secondary | ICD-10-CM | POA: Diagnosis not present

## 2022-05-05 DIAGNOSIS — E785 Hyperlipidemia, unspecified: Secondary | ICD-10-CM | POA: Insufficient documentation

## 2022-05-05 DIAGNOSIS — I2582 Chronic total occlusion of coronary artery: Secondary | ICD-10-CM | POA: Insufficient documentation

## 2022-05-05 DIAGNOSIS — I2584 Coronary atherosclerosis due to calcified coronary lesion: Secondary | ICD-10-CM | POA: Diagnosis not present

## 2022-05-05 DIAGNOSIS — Z79899 Other long term (current) drug therapy: Secondary | ICD-10-CM | POA: Diagnosis not present

## 2022-05-05 HISTORY — PX: LEFT HEART CATH AND CORONARY ANGIOGRAPHY: CATH118249

## 2022-05-05 SURGERY — LEFT HEART CATH AND CORONARY ANGIOGRAPHY
Anesthesia: LOCAL

## 2022-05-05 MED ORDER — VERAPAMIL HCL 2.5 MG/ML IV SOLN
INTRAVENOUS | Status: AC
  Filled 2022-05-05: qty 2

## 2022-05-05 MED ORDER — MIDAZOLAM HCL 2 MG/2ML IJ SOLN
INTRAMUSCULAR | Status: AC
  Filled 2022-05-05: qty 2

## 2022-05-05 MED ORDER — ONDANSETRON HCL 4 MG/2ML IJ SOLN: 4.0000 mg | Freq: Four times a day (QID) | INTRAMUSCULAR | Status: DC | PRN

## 2022-05-05 MED ORDER — HEPARIN SODIUM (PORCINE) 1000 UNIT/ML IJ SOLN
INTRAMUSCULAR | Status: DC | PRN
  Administered 2022-05-05: 5000 [IU] via INTRAVENOUS

## 2022-05-05 MED ORDER — LABETALOL HCL 5 MG/ML IV SOLN: 10.0000 mg | INTRAVENOUS | Status: DC | PRN

## 2022-05-05 MED ORDER — ACETAMINOPHEN 325 MG PO TABS: 650.0000 mg | ORAL_TABLET | ORAL | Status: DC | PRN

## 2022-05-05 MED ORDER — VERAPAMIL HCL 2.5 MG/ML IV SOLN
INTRAVENOUS | Status: DC | PRN
  Administered 2022-05-05: 10 mL via INTRA_ARTERIAL

## 2022-05-05 MED ORDER — LIDOCAINE HCL (PF) 1 % IJ SOLN
INTRAMUSCULAR | Status: DC | PRN
  Administered 2022-05-05: 2 mL

## 2022-05-05 MED ORDER — FENTANYL CITRATE (PF) 100 MCG/2ML IJ SOLN
INTRAMUSCULAR | Status: DC | PRN
  Administered 2022-05-05: 50 ug via INTRAVENOUS

## 2022-05-05 MED ORDER — SODIUM CHLORIDE 0.9 % IV SOLN: INTRAVENOUS | Status: DC

## 2022-05-05 MED ORDER — SODIUM CHLORIDE 0.9 % IV SOLN: 250.0000 mL | INTRAVENOUS | Status: DC | PRN

## 2022-05-05 MED ORDER — FENTANYL CITRATE (PF) 100 MCG/2ML IJ SOLN
INTRAMUSCULAR | Status: AC
  Filled 2022-05-05: qty 2

## 2022-05-05 MED ORDER — MIDAZOLAM HCL 2 MG/2ML IJ SOLN
INTRAMUSCULAR | Status: DC | PRN
  Administered 2022-05-05: 2 mg via INTRAVENOUS

## 2022-05-05 MED ORDER — SODIUM CHLORIDE 0.9% FLUSH: 3.0000 mL | Freq: Two times a day (BID) | INTRAVENOUS | Status: DC

## 2022-05-05 MED ORDER — HYDRALAZINE HCL 20 MG/ML IJ SOLN: 10.0000 mg | INTRAMUSCULAR | Status: DC | PRN

## 2022-05-05 MED ORDER — SODIUM CHLORIDE 0.9% FLUSH: 3.0000 mL | INTRAVENOUS | Status: DC | PRN

## 2022-05-05 MED ORDER — HEPARIN SODIUM (PORCINE) 1000 UNIT/ML IJ SOLN
INTRAMUSCULAR | Status: AC
  Filled 2022-05-05: qty 10

## 2022-05-05 MED ORDER — IOHEXOL 350 MG/ML SOLN
INTRAVENOUS | Status: DC | PRN
  Administered 2022-05-05: 55 mL

## 2022-05-05 MED ORDER — HEPARIN (PORCINE) IN NACL 1000-0.9 UT/500ML-% IV SOLN
INTRAVENOUS | Status: DC | PRN
  Administered 2022-05-05 (×2): 500 mL

## 2022-05-05 MED ORDER — LIDOCAINE HCL (PF) 1 % IJ SOLN
INTRAMUSCULAR | Status: AC
  Filled 2022-05-05: qty 30

## 2022-05-05 SURGICAL SUPPLY — 9 items
CATH 5FR JL3.5 JR4 ANG PIG MP (CATHETERS) IMPLANT
DEVICE RAD COMP TR BAND LRG (VASCULAR PRODUCTS) IMPLANT
GLIDESHEATH SLEND SS 6F .021 (SHEATH) IMPLANT
GUIDEWIRE INQWIRE 1.5J.035X260 (WIRE) IMPLANT
INQWIRE 1.5J .035X260CM (WIRE) ×1
KIT HEART LEFT (KITS) ×1 IMPLANT
PACK CARDIAC CATHETERIZATION (CUSTOM PROCEDURE TRAY) ×1 IMPLANT
TRANSDUCER W/STOPCOCK (MISCELLANEOUS) ×1 IMPLANT
TUBING CIL FLEX 10 FLL-RA (TUBING) ×1 IMPLANT

## 2022-05-05 NOTE — Interval H&P Note (Signed)
History and Physical Interval Note:  05/05/2022 1:03 PM  Mitchell Fox  has presented today for surgery, with the diagnosis of Coronary CT +.  The various methods of treatment have been discussed with the patient and family. After consideration of risks, benefits and other options for treatment, the patient has consented to  Procedure(s): LEFT HEART CATH AND CORONARY ANGIOGRAPHY (N/A) as a surgical intervention.  The patient's history has been reviewed, patient examined, no change in status, stable for surgery.  I have reviewed the patient's chart and labs.  Questions were answered to the patient's satisfaction.    Cath Lab Visit (complete for each Cath Lab visit)  Clinical Evaluation Leading to the Procedure:   ACS: No.  Non-ACS:    Anginal Classification: CCS II  Anti-ischemic medical therapy: No Therapy  Non-Invasive Test Results: High-risk stress test findings: cardiac mortality >3%/year (Coronary CTA with severe LAD and RCA stenosis)  Prior CABG: No previous CABG        Lauree Chandler

## 2022-05-05 NOTE — Discharge Instructions (Signed)
DRINK PLENTY OF FLUIDS OVER THE NEXT 2-3 DAYS.

## 2022-05-05 NOTE — Progress Notes (Signed)
TR BAND REMOVAL  LOCATION:    right radial  DEFLATED PER PROTOCOL:    Yes.    TIME BAND OFF / DRESSING APPLIED:    1545 gauze dressing applied   SITE UPON ARRIVAL:    Level 0  SITE AFTER BAND REMOVAL:    Level 0  CIRCULATION SENSATION AND MOVEMENT:    Within Normal Limits   Yes.    COMMENTS:   no issues noted  

## 2022-05-08 ENCOUNTER — Encounter (HOSPITAL_COMMUNITY): Payer: Self-pay | Admitting: Cardiovascular Disease

## 2022-06-14 ENCOUNTER — Ambulatory Visit: Payer: BC Managed Care – PPO | Attending: Cardiovascular Disease | Admitting: Cardiovascular Disease

## 2022-06-14 ENCOUNTER — Ambulatory Visit: Payer: BC Managed Care – PPO

## 2022-06-14 ENCOUNTER — Encounter: Payer: Self-pay | Admitting: Cardiovascular Disease

## 2022-06-14 VITALS — BP 120/80 | HR 73 | Ht 72.0 in | Wt 226.8 lb

## 2022-06-14 DIAGNOSIS — E782 Mixed hyperlipidemia: Secondary | ICD-10-CM | POA: Diagnosis not present

## 2022-06-14 DIAGNOSIS — R9431 Abnormal electrocardiogram [ECG] [EKG]: Secondary | ICD-10-CM

## 2022-06-14 DIAGNOSIS — I2119 ST elevation (STEMI) myocardial infarction involving other coronary artery of inferior wall: Secondary | ICD-10-CM | POA: Diagnosis not present

## 2022-06-14 DIAGNOSIS — I2510 Atherosclerotic heart disease of native coronary artery without angina pectoris: Secondary | ICD-10-CM | POA: Diagnosis not present

## 2022-06-14 DIAGNOSIS — Z01810 Encounter for preprocedural cardiovascular examination: Secondary | ICD-10-CM

## 2022-06-14 DIAGNOSIS — E785 Hyperlipidemia, unspecified: Secondary | ICD-10-CM | POA: Insufficient documentation

## 2022-06-14 NOTE — Patient Instructions (Signed)
Medication Instructions:  Your physician recommends that you continue on your current medications as directed. Please refer to the Current Medication list given to you today.  *If you need a refill on your cardiac medications before your next appointment, please call your pharmacy*   Lab Work: NONE If you have labs (blood work) drawn today and your tests are completely normal, you will receive your results only by: MyChart Message (if you have MyChart) OR A paper copy in the mail If you have any lab test that is abnormal or we need to change your treatment, we will call you to review the results.   Testing/Procedures: NONE   Follow-Up: At [facility], you and your health needs are our priority.  As part of our continuing mission to provide you with exceptional heart care, we have created designated Provider Care Teams.  These Care Teams include your primary Cardiologist (physician) and Advanced Practice Providers (APPs -  Physician Assistants and Nurse Practitioners) who all work together to provide you with the care you need, when you need it.  We recommend signing up for the patient portal called "MyChart".  Sign up information is provided on this After Visit Summary.  MyChart is used to connect with patients for Virtual Visits (Telemedicine).  Patients are able to view lab/test results, encounter notes, upcoming appointments, etc.  Non-urgent messages can be sent to your provider as well.   To learn more about what you can do with MyChart, go to [email or web address].    Your next appointment:   4 month(s)  Provider:   Philip Nahser, MD   

## 2022-06-14 NOTE — Progress Notes (Signed)
Cardiology Office Note:    Date:  06/14/2022   ID:  [name], DOB 1961-02-20, MRN [id number]  PCP:  Daisy Floro, MD   [facility] Providers Cardiologist:  Fleeta Kunde      Referring MD: Daisy Floro, MD   Chief Complaint  Patient presents with   Coronary Artery Disease   Hyperlipidemia    History of Present Illness:    Mitchell Fox is a 62 y.o. male with a hx of coronary artery calcifications.  Seen with wife , [name]    CAC score is 739 Left anterior descending artery: 422   Left circumflex artery: 75.3   Right coronary artery: 242   Total: 739   Percentile: 92  Walks frequently  Has not been walking for the past 3 months . Walked this weekend, no CP or dyspnea   + fam hx . Uncle died of MI in his 96s Grandfather had CAD   Here for good recent labs from his primary medical doctor from March 10, 2022 Total cholesterol is 189 HDL is 62 LDL is 110 Triglyceride level is 92 Hemoglobin A1c is 5.9 Creatinine is 0.83 Potassium is 4.6  June 14, 2022  Mitchell Fox is seen today for follow up of his DOE, chest pressure, HLD and coronary calcifications  Cath on March 15 , 2024 shows  Mild - moderate atherosclerosis of his LM, LAD ,  His distal RCA is occluded and is supplied by collaterals from the left side and faint collaterals from the right .   They are walking 3 miles a day .  No CP , no dyspnea  Wt is 226 lbs     History reviewed. No pertinent past medical history.  Past Surgical History:  Procedure Laterality Date   LEFT HEART CATH AND CORONARY ANGIOGRAPHY N/A 05/05/2022   Procedure: LEFT HEART CATH AND CORONARY ANGIOGRAPHY;  Surgeon: Kathleene Hazel, MD;  Location: [facility] INVASIVE CV LAB;  Service: Cardiovascular;  Laterality: N/A;    Current Medications: Current Meds  Medication Sig   acetaminophen (TYLENOL) 325 MG tablet Take 1 tablet (325 mg total) by mouth as needed.   aspirin EC 81 MG tablet Take 1 tablet (81  mg total) by mouth daily. Swallow whole.   ezetimibe (ZETIA) 10 MG tablet Take 1 tablet (10 mg total) by mouth daily.   ibuprofen (ADVIL) 200 MG tablet Take 1 tablet (200 mg total) by mouth as needed.   loratadine-pseudoephedrine (CLARITIN-D 12 HOUR) 5-120 MG tablet Take 1 tablet by mouth as needed for allergies.   oxymetazoline (AFRIN) 0.05 % nasal spray Place 1 spray into both nostrils daily.   rosuvastatin (CRESTOR) 20 MG tablet Take 20 mg by mouth at bedtime.     Allergies:   Penicillins and Shellfish allergy   Social History   Socioeconomic History   Marital status: Married    Spouse name: Not on file   Number of children: Not on file   Years of education: Not on file   Highest education level: Not on file  Occupational History   Not on file  Tobacco Use   Smoking status: Never   Smokeless tobacco: Never  Substance and Sexual Activity   Alcohol use: Not on file   Drug use: Not on file   Sexual activity: Not on file  Other Topics Concern   Not on file  Social History Narrative   Not on file   Social Determinants of Health   Financial Resource Strain: Not on file  Food Insecurity: Not on file  Transportation Needs: Not on file  Physical Activity: Not on file  Stress: Not on file  Social Connections: Not on file     Family History: The patient's family history is not on file.  ROS:   Please see the history of present illness.     All other systems reviewed and are negative.  EKGs/Labs/Other Studies Reviewed:    The following studies were reviewed today:   EKG:    Recent Labs: 04/25/2022: BUN 13; Creatinine, Ser 0.82; Potassium 4.0; Sodium 143 05/04/2022: Hemoglobin 14.2; Platelets 246  Recent Lipid Panel No results found for: "CHOL", "TRIG", "HDL", "CHOLHDL", "VLDL", "LDLCALC", "LDLDIRECT"   Risk Assessment/Calculations:                Physical Exam:    VS:  BP 120/80   Pulse 73   Ht 6' (1.829 m)   Wt 226 lb 12.8 oz (102.9 kg)   SpO2 96%    BMI 30.76 kg/m     Wt Readings from Last 3 Encounters:  06/14/22 226 lb 12.8 oz (102.9 kg)  05/05/22 225 lb (102.1 kg)  04/25/22 230 lb 3.2 oz (104.4 kg)     GEN:  Well nourished, well developed in no acute distress HEENT: Normal NECK: No JVD; No carotid bruits LYMPHATICS: No lymphadenopathy CARDIAC: RRR, no murmurs, rubs, gallops RESPIRATORY:  Clear to auscultation without rales, wheezing or rhonchi  ABDOMEN: Soft, non-tender, non-distended MUSCULOSKELETAL:  No edema; No deformity  SKIN: Warm and dry NEUROLOGIC:  Alert and oriented x 3 PSYCHIATRIC:  Normal affect   ASSESSMENT:    1. Coronary artery disease involving native coronary artery of native heart without angina pectoris   2. Mixed hyperlipidemia   3. Inferior myocardial infarction     PLAN:      Coronary artery disease: At cath [place]  Was found to have moderate left main and LAD disease.  Circumflex artery has only minor luminal regularities.  His right coronary artery is occluded distally.  The distal RCA fills via left to right and some very tiny right to right collaterals.  Overall he is very stable.  He has been exercising without any angina.  2.  Hyperlipidemia: His LDL was 110.  He has been on rosuvastatin 20 mg a day.  He has been working on improved diet and exercise program.  He had labs drawn this morning.  Our goal for him is an LDL of 50-70.  I will see him again in 4 months.            Medication Adjustments/Labs and Tests Ordered: Current medicines are reviewed at length with the patient today.  Concerns regarding medicines are outlined above.  No orders of the defined types were placed in this encounter.  No orders of the defined types were placed in this encounter.   Patient Instructions  Medication Instructions:  Your physician recommends that you continue on your current medications as directed. Please refer to the Current Medication list given to you today.   *If you need a refill  on your cardiac medications before your next appointment, please call your pharmacy*   Lab Work: NONE If you have labs (blood work) drawn today and your tests are completely normal, you will receive your results only by: MyChart Message (if you have MyChart) OR A paper copy in the mail If you have any lab test that is abnormal or we need to change your treatment, we will call you to  review the results.   Testing/Procedures: NONE   Follow-Up: At [facility], you and your health needs are our priority.  As part of our continuing mission to provide you with exceptional heart care, we have created designated Provider Care Teams.  These Care Teams include your primary Cardiologist (physician) and Advanced Practice Providers (APPs -  Physician Assistants and Nurse Practitioners) who all work together to provide you with the care you need, when you need it.  We recommend signing up for the patient portal called "MyChart".  Sign up information is provided on this After Visit Summary.  MyChart is used to connect with patients for Virtual Visits (Telemedicine).  Patients are able to view lab/test results, encounter notes, upcoming appointments, etc.  Non-urgent messages can be sent to your provider as well.   To learn more about what you can do with MyChart, go to [email or web address].    Your next appointment:   4 month(s)  Provider:   Kristeen Miss, MD        Signed, Kristeen Miss, MD  06/14/2022 5:08 PM    [facility]

## 2022-06-15 LAB — LIPID PANEL
Chol/HDL Ratio: 1.8 ratio (ref 0.0–5.0)
Cholesterol, Total: 116 mg/dL (ref 100–199)
HDL: 64 mg/dL (ref >39)
LDL Chol Calc (NIH): 39 mg/dL (ref 0–99)
Triglycerides: 57 mg/dL (ref 0–149)
VLDL Cholesterol Cal: 13 mg/dL (ref 5–40)

## 2022-07-10 ENCOUNTER — Telehealth: Payer: Self-pay | Admitting: Cardiovascular Disease

## 2022-07-10 NOTE — Telephone Encounter (Signed)
Casimiro Needle from [employer] is calling b/c they need the records from the labs the patient had sent over to [facility]. Casimiro Needle is requesting we fax it to [phone]. Please advise.

## 2022-07-18 NOTE — Telephone Encounter (Signed)
Labs printed and faxed to number provided (attn: Casimiro Needle). Confirmation received.

## 2022-08-02 NOTE — Telephone Encounter (Signed)
Updated office note and labs were re-faxed yesterday (6/11) to Casimiro Needle as well as, emailed to [employer] (pt's wife) per request. However, did print the same office note and am placing in mail tote today. Called and left message to make [name] aware.

## 2022-08-02 NOTE — Telephone Encounter (Signed)
Casimiro Needle calling back stating they need to know why the lipoprotein lab from 04/25/22 was ordered. He states he would like it mailed instead to address below.   [address] [place] [address]

## 2022-10-18 ENCOUNTER — Encounter: Payer: Self-pay | Admitting: Cardiovascular Disease

## 2022-10-18 NOTE — Progress Notes (Unsigned)
Cardiology Office Note:    Date:  10/19/2022   ID:  Mitchell Fox Fox, DOB 11/29/1960, MRN [id number]  PCP:  Daisy Floro, MD   [facility] Providers Cardiologist:  Edin Kon      Referring MD: Daisy Floro, MD   Chief Complaint  Patient presents with   Coronary Artery Disease         History of Present Illness:    Mitchell Fox Fox is a 62 y.o. male with a hx of coronary artery calcifications.  Seen with wife , [name]    CAC score is 739 Left anterior descending artery: 422   Left circumflex artery: 75.3   Right coronary artery: 242   Total: 739   Percentile: 92  Walks frequently  Has not been walking for the past 3 months . Walked this weekend, no CP or dyspnea   + fam hx . Uncle died of MI in his 4s Grandfather had CAD   Here for good recent labs from his primary medical doctor from March 10, 2022 Total cholesterol is 189 HDL is 62 LDL is 110 Triglyceride level is 92 Hemoglobin A1c is 5.9 Creatinine is 0.83 Potassium is 4.6  June 14, 2022  Mitchell Fox is seen today for follow up of his DOE, chest pressure, HLD and coronary calcifications  Cath on March 15 , 2024 shows  Mild - moderate atherosclerosis of his LM, LAD ,  His distal RCA is occluded and is supplied by collaterals from the left side and faint collaterals from the right .   They are walking 3 miles a day .  No CP , no dyspnea  Wt is 226 lbs   Aug. 29, 2024 Mitchell Fox is seen for follow up of is mild - moderate CAD, DOE , HLD  Feels well  Still walking No angina    History reviewed. No pertinent past medical history.  Past Surgical History:  Procedure Laterality Date   LEFT HEART CATH AND CORONARY ANGIOGRAPHY N/A 05/05/2022   Procedure: LEFT HEART CATH AND CORONARY ANGIOGRAPHY;  Surgeon: Kathleene Hazel, MD;  Location: [facility];  Service: Cardiovascular;  Laterality: N/A;    Current Medications: Current Meds  Medication Sig   acetaminophen  (TYLENOL) 325 MG tablet Take 1 tablet (325 mg total) by mouth as needed.   aspirin EC 81 MG tablet Take 1 tablet (81 mg total) by mouth daily. Swallow whole.   ezetimibe (ZETIA) 10 MG tablet Take 1 tablet (10 mg total) by mouth daily.   ibuprofen (ADVIL) 200 MG tablet Take 1 tablet (200 mg total) by mouth as needed.   loratadine-pseudoephedrine (CLARITIN-D 12 HOUR) 5-120 MG tablet Take 1 tablet by mouth as needed for allergies.   oxymetazoline (AFRIN) 0.05 % nasal spray Place 1 spray into both nostrils daily.   rosuvastatin (CRESTOR) 20 MG tablet Take 20 mg by mouth at bedtime.     Allergies:   Penicillins and Shellfish allergy   Social History   Socioeconomic History   Marital status: Married    Spouse name: Not on file   Number of children: Not on file   Years of education: Not on file   Highest education level: Not on file  Occupational History   Not on file  Tobacco Use   Smoking status: Never   Smokeless tobacco: Never  Substance and Sexual Activity   Alcohol use: Not on file   Drug use: Not on file   Sexual activity: Not on file  Other Topics  Concern   Not on file  Social History Narrative   Not on file   Social Determinants of Health   Financial Resource Strain: Not on file  Food Insecurity: Not on file  Transportation Needs: Not on file  Physical Activity: Not on file  Stress: Not on file  Social Connections: Not on file     Family History: The patient's family history is not on file.  ROS:   Please see the history of present illness.     All other systems reviewed and are negative.  EKGs/Labs/Other Studies Reviewed:    The following studies were reviewed today:   EKG:          Recent Labs: 04/25/2022: BUN 13; Creatinine, Ser 0.82; Potassium 4.0; Sodium 143 05/04/2022: Hemoglobin 14.2; Platelets 246  Recent Lipid Panel    Component Value Date/Time   CHOL 116 06/14/2022 0850   TRIG 57 06/14/2022 0850   HDL 64 06/14/2022 0850   CHOLHDL 1.8  06/14/2022 0850   LDLCALC 39 06/14/2022 0850     Risk Assessment/Calculations:             Physical Exam: Blood pressure 132/76, pulse 69, height 6' (1.829 m), weight 226 lb (102.5 kg), SpO2 96%.     GEN:  Well nourished, well developed in no acute distress HEENT: Normal NECK: No JVD; No carotid bruits LYMPHATICS: No lymphadenopathy CARDIAC: RRR , no murmurs, rubs, gallops RESPIRATORY:  Clear to auscultation without rales, wheezing or rhonchi  ABDOMEN: Soft, non-tender, non-distended MUSCULOSKELETAL:  No edema; No deformity  SKIN: Warm and dry NEUROLOGIC:  Alert and oriented x 3   ASSESSMENT:    1. Coronary artery disease involving native coronary artery of native heart without angina pectoris   2. Inferior myocardial infarction (HCC)   3. Mixed hyperlipidemia      PLAN:      Coronary artery disease: At cath [place]  Was found to have moderate left main and LAD disease.  Circumflex artery has only minor luminal regularities.  His right coronary artery is occluded distally.  The distal RCA fills via left to right and some very tiny right to right collaterals.  Overall he is very stable.  He is walking regularly.  He is not having any angina.   2.  Hyperlipidemia:   His last LDL was 39.  I have recommended he reduce his intake of saturated fats.  He still eats some processed meats.  Continue to exercise.  Continue current medications.  He will have his lipids drawn at his primary medical doctor's office in January.  I will plan on seeing him again in 1 year.              Medication Adjustments/Labs and Tests Ordered: Current medicines are reviewed at length with the patient today.  Concerns regarding medicines are outlined above.  No orders of the defined types were placed in this encounter.  No orders of the defined types were placed in this encounter.   Patient Instructions  Medication Instructions:   Your physician recommends that you continue on your  current medications as directed. Please refer to the Current Medication list given to you today.  *If you need a refill on your cardiac medications before your next appointment, please call your pharmacy*    Follow-Up: At [facility], you and your health needs are our priority.  As part of our continuing mission to provide you with exceptional heart care, we have created designated Provider Care Teams.  These Care Teams include your primary Cardiologist (physician) and Advanced Practice Providers (APPs -  Physician Assistants and Nurse Practitioners) who all work together to provide you with the care you need, when you need it.  We recommend signing up for the patient portal called "MyChart".  Sign up information is provided on this After Visit Summary.  MyChart is used to connect with patients for Virtual Visits (Telemedicine).  Patients are able to view lab/test results, encounter notes, upcoming appointments, etc.  Non-urgent messages can be sent to your provider as well.   To learn more about what you can do with MyChart, go to [email or web address].    Your next appointment:   1 year(s)  Provider:   Kristeen Miss, MD         Signed, Kristeen Miss, MD  10/19/2022 4:58 PM    [facility]

## 2022-10-19 ENCOUNTER — Encounter: Payer: Self-pay | Admitting: Cardiovascular Disease

## 2022-10-19 ENCOUNTER — Ambulatory Visit: Payer: BC Managed Care – PPO | Attending: Cardiovascular Disease | Admitting: Cardiovascular Disease

## 2022-10-19 VITALS — BP 132/76 | HR 69 | Ht 72.0 in | Wt 226.0 lb

## 2022-10-19 DIAGNOSIS — I2510 Atherosclerotic heart disease of native coronary artery without angina pectoris: Secondary | ICD-10-CM

## 2022-10-19 DIAGNOSIS — E782 Mixed hyperlipidemia: Secondary | ICD-10-CM

## 2022-10-19 DIAGNOSIS — I2119 ST elevation (STEMI) myocardial infarction involving other coronary artery of inferior wall: Secondary | ICD-10-CM

## 2022-10-19 NOTE — Patient Instructions (Signed)
Medication Instructions:   Your physician recommends that you continue on your current medications as directed. Please refer to the Current Medication list given to you today.  *If you need a refill on your cardiac medications before your next appointment, please call your pharmacy*    Follow-Up: At [facility], you and your health needs are our priority.  As part of our continuing mission to provide you with exceptional heart care, we have created designated Provider Care Teams.  These Care Teams include your primary Cardiologist (physician) and Advanced Practice Providers (APPs -  Physician Assistants and Nurse Practitioners) who all work together to provide you with the care you need, when you need it.  We recommend signing up for the patient portal called "MyChart".  Sign up information is provided on this After Visit Summary.  MyChart is used to connect with patients for Virtual Visits (Telemedicine).  Patients are able to view lab/test results, encounter notes, upcoming appointments, etc.  Non-urgent messages can be sent to your provider as well.   To learn more about what you can do with MyChart, go to [email or web address].    Your next appointment:   1 year(s)  Provider:   Philip Nahser, MD       

## 2022-12-08 DIAGNOSIS — Z23 Encounter for immunization: Secondary | ICD-10-CM | POA: Diagnosis not present

## 2023-03-16 DIAGNOSIS — R7303 Prediabetes: Secondary | ICD-10-CM | POA: Diagnosis not present

## 2023-03-16 DIAGNOSIS — E559 Vitamin D deficiency, unspecified: Secondary | ICD-10-CM | POA: Diagnosis not present

## 2023-03-16 DIAGNOSIS — Z125 Encounter for screening for malignant neoplasm of prostate: Secondary | ICD-10-CM | POA: Diagnosis not present

## 2023-03-16 DIAGNOSIS — E538 Deficiency of other specified B group vitamins: Secondary | ICD-10-CM | POA: Diagnosis not present

## 2023-03-16 DIAGNOSIS — Z13220 Encounter for screening for lipoid disorders: Secondary | ICD-10-CM | POA: Diagnosis not present

## 2023-03-16 DIAGNOSIS — Z1159 Encounter for screening for other viral diseases: Secondary | ICD-10-CM | POA: Diagnosis not present

## 2023-03-16 DIAGNOSIS — Z0000 Encounter for general adult medical examination without abnormal findings: Secondary | ICD-10-CM | POA: Diagnosis not present

## 2023-03-16 LAB — LAB REPORT - SCANNED: HM Hepatitis Screen: NEGATIVE

## 2023-03-22 DIAGNOSIS — Z0000 Encounter for general adult medical examination without abnormal findings: Secondary | ICD-10-CM | POA: Diagnosis not present

## 2023-04-18 ENCOUNTER — Other Ambulatory Visit: Payer: Self-pay | Admitting: Cardiovascular Disease

## 2023-04-18 DIAGNOSIS — R9431 Abnormal electrocardiogram [ECG] [EKG]: Secondary | ICD-10-CM

## 2023-04-18 DIAGNOSIS — E782 Mixed hyperlipidemia: Secondary | ICD-10-CM

## 2023-04-18 DIAGNOSIS — Z01810 Encounter for preprocedural cardiovascular examination: Secondary | ICD-10-CM

## 2023-05-30 ENCOUNTER — Telehealth: Payer: Self-pay | Admitting: Cardiovascular Disease

## 2023-05-30 NOTE — Telephone Encounter (Signed)
 Caller Casimiro Needle) stated he is following-up on a claim for patient's LIPOPROTEIN A test done on 04/25/22.  Caller stated medical information needs to be sent to [facility] at email: [email or web address] or fax# [phone].

## 2023-06-01 NOTE — Telephone Encounter (Signed)
 Printed last office note and encounter from June last year when I sent over this same documentation. Faxed AND emailed at requested contact information provided.

## 2023-06-21 DIAGNOSIS — R972 Elevated prostate specific antigen [PSA]: Secondary | ICD-10-CM | POA: Diagnosis not present

## 2023-10-15 ENCOUNTER — Other Ambulatory Visit (HOSPITAL_COMMUNITY): Payer: Self-pay

## 2023-10-15 ENCOUNTER — Encounter: Payer: Self-pay | Admitting: *Deleted

## 2023-10-15 ENCOUNTER — Telehealth: Payer: Self-pay

## 2023-10-15 NOTE — Telephone Encounter (Signed)
 Patient came in requesting to see [place] for his provider has retired and he would like to change to this provider.  Please advise   10-15-23  VB

## 2023-10-15 NOTE — Telephone Encounter (Signed)
 Scheduled per recall.  Arranged this w Dr. Verlin.  Sent pt mychart message letting him know.  This has been read.

## 2023-10-24 ENCOUNTER — Encounter: Payer: Self-pay | Admitting: Cardiovascular Disease

## 2023-10-24 DIAGNOSIS — Z01810 Encounter for preprocedural cardiovascular examination: Secondary | ICD-10-CM

## 2023-10-24 DIAGNOSIS — R9431 Abnormal electrocardiogram [ECG] [EKG]: Secondary | ICD-10-CM

## 2023-10-24 DIAGNOSIS — E782 Mixed hyperlipidemia: Secondary | ICD-10-CM

## 2023-10-24 MED ORDER — EZETIMIBE 10 MG PO TABS: 10.0000 mg | ORAL_TABLET | Freq: Every day | ORAL | 0 refills | Status: DC

## 2023-11-30 ENCOUNTER — Encounter: Payer: Self-pay | Admitting: Cardiovascular Disease

## 2023-12-05 ENCOUNTER — Ambulatory Visit: Attending: Cardiovascular Disease | Admitting: Cardiovascular Disease

## 2023-12-05 ENCOUNTER — Encounter: Payer: Self-pay | Admitting: Cardiovascular Disease

## 2023-12-05 VITALS — BP 166/86 | HR 63 | Ht 72.0 in | Wt 233.2 lb

## 2023-12-05 DIAGNOSIS — E782 Mixed hyperlipidemia: Secondary | ICD-10-CM | POA: Diagnosis not present

## 2023-12-05 DIAGNOSIS — I2510 Atherosclerotic heart disease of native coronary artery without angina pectoris: Secondary | ICD-10-CM | POA: Diagnosis not present

## 2023-12-05 DIAGNOSIS — Z23 Encounter for immunization: Secondary | ICD-10-CM | POA: Diagnosis not present

## 2023-12-05 NOTE — Progress Notes (Signed)
 Chief Complaint  Patient presents with   Follow-up    CAD   History of Present Illness: 63 yo male with history of CAD and HLD who is here today for follow up. He has been followed in our office by Dr. Alveta. Cardiac cath in March 2024 with mild disease in the left main and LAD. Large dominant Circumflex with no disease. Non-dominant RCA with distal occlusion with filling of the PDA from left to right collaterals. Echo March 2024 with LVEF=60-65%. No valve disease.   He is here today for follow up. The patient denies any chest pain, dyspnea, palpitations, lower extremity edema, orthopnea, PND, dizziness, near syncope or syncope.   Primary Care Physician: Okey Carlin Redbird, MD   Past Medical History:  Diagnosis Date   CAD (coronary artery disease)    HLD (hyperlipidemia)    Past Surgical History:  Procedure Laterality Date   LEFT HEART CATH AND CORONARY ANGIOGRAPHY N/A 05/05/2022   Procedure: LEFT HEART CATH AND CORONARY ANGIOGRAPHY;  Surgeon: Verlin Lonni BIRCH, MD;  Location: [facility];  Service: Cardiovascular;  Laterality: N/A;   Current Outpatient Medications  Medication Sig Dispense Refill   acetaminophen  (TYLENOL ) 325 MG tablet Take 1 tablet (325 mg total) by mouth as needed.     aspirin EC 81 MG tablet Take 1 tablet (81 mg total) by mouth daily. Swallow whole.     ezetimibe  (ZETIA ) 10 MG tablet Take 1 tablet (10 mg total) by mouth daily. 90 tablet 0   ibuprofen (ADVIL) 200 MG tablet Take 1 tablet (200 mg total) by mouth as needed.     Multiple Vitamin (MULTI VITAMIN) TABS 1 tablet Orally Once a day     oxymetazoline (AFRIN) 0.05 % nasal spray Place 1 spray into both nostrils daily. (Patient taking differently: Place 1 spray into both nostrils as needed.)     rosuvastatin (CRESTOR) 20 MG tablet Take 20 mg by mouth at bedtime.     loratadine-pseudoephedrine (CLARITIN-D 12 HOUR) 5-120 MG tablet Take 1 tablet by mouth as needed for allergies.     No current  facility-administered medications for this visit.    Allergies  Allergen Reactions   Penicillins Anaphylaxis   Shellfish Allergy Other (See Comments)    Social History   Socioeconomic History   Marital status: Married    Spouse name: Not on file   Number of children: 2   Years of education: Not on file   Highest education level: Not on file  Occupational History   Occupation: Social research officer, government  Tobacco Use   Smoking status: Never   Smokeless tobacco: Never  Substance and Sexual Activity   Alcohol use: Not on file   Drug use: Not on file   Sexual activity: Not on file  Other Topics Concern   Not on file  Social History Narrative   Not on file   Social Drivers of Health   Financial Resource Strain: Not on file  Food Insecurity: Not on file  Transportation Needs: Not on file  Physical Activity: Not on file  Stress: Not on file  Social Connections: Not on file  Intimate Partner Violence: Not on file    Family History  Problem Relation Age of Onset   Diabetes Mellitus II Mother     Review of Systems:  As stated in the HPI and otherwise negative.   BP (!) 166/86   Pulse 63   Ht 6' (1.829 m)   Wt 233 lb 3.2  oz (105.8 kg)   SpO2 97%   BMI 31.63 kg/m   Physical Examination: General: Well developed, well nourished, NAD  HEENT: OP clear, mucus membranes moist  SKIN: warm, dry. No rashes. Neuro: No focal deficits  Musculoskeletal: Muscle strength 5/5 all ext  Psychiatric: Mood and affect normal  Neck: No JVD, no carotid bruits, no thyromegaly, no lymphadenopathy.  Lungs:Clear bilaterally, no wheezes, rhonci, crackles Cardiovascular: Regular rate and rhythm. No murmurs, gallops or rubs. Abdomen:Soft. Bowel sounds present. Non-tender.  Extremities: No lower extremity edema. Pulses are 2 + in the bilateral DP/PT.  EKG:  EKG is ordered today. The ekg ordered today demonstrates  EKG Interpretation Date/Time:  Wednesday December 05 2023 10:17:38  EDT Ventricular Rate:  63 PR Interval:  170 QRS Duration:  96 QT Interval:  398 QTC Calculation: 407 R Axis:   126  Text Interpretation: Normal sinus rhythm Left posterior fascicular block Inferior infarct , age undetermined Confirmed by Verlin Bruckner [phone]) on 12/05/2023 10:34:35 AM    Recent Labs: No results found for requested labs within last 365 days.   Lipid Panel    Component Value Date/Time   CHOL 116 06/14/2022 0850   TRIG 57 06/14/2022 0850   HDL 64 06/14/2022 0850   CHOLHDL 1.8 06/14/2022 0850   LDLCALC 39 06/14/2022 0850     Wt Readings from Last 3 Encounters:  12/05/23 233 lb 3.2 oz (105.8 kg)  10/19/22 226 lb (102.5 kg)  06/14/22 226 lb 12.8 oz (102.9 kg)    Assessment and Plan:   1. CAD without angina: No chest pain. Continue ASA, Crestor and Zetia . BP is elevated today. He is asked to follow this at home and call back if his BP is elevated consistently.   2. Hyperlipidemia: LDL 62 in January 2025. Continue Crestor and Zetia .   Labs/ tests ordered today include:   Orders Placed This Encounter  Procedures   EKG 12-Lead   Disposition:   F/U with me in one year   Signed, Bruckner Verlin, MD, [facility] 12/05/2023 10:54 AM    [facility] [address] [place], [place], [place]  [address] Phone: [phone]; Fax: [phone]

## 2023-12-05 NOTE — Patient Instructions (Signed)

## 2024-01-19 ENCOUNTER — Other Ambulatory Visit: Payer: Self-pay | Admitting: Cardiovascular Disease

## 2024-01-19 DIAGNOSIS — R9431 Abnormal electrocardiogram [ECG] [EKG]: Secondary | ICD-10-CM

## 2024-01-19 DIAGNOSIS — Z01810 Encounter for preprocedural cardiovascular examination: Secondary | ICD-10-CM

## 2024-01-19 DIAGNOSIS — E782 Mixed hyperlipidemia: Secondary | ICD-10-CM

## 2024-03-25 LAB — LAB REPORT - SCANNED: EGFR: 102
